# Patient Record
Sex: Female | Born: 1975 | Race: White | Hispanic: No | Marital: Married | Smoking: Never smoker
Health system: Southern US, Community
[De-identification: ages and names within clinical notes are randomized; demographics above are authoritative.]

## PROBLEM LIST (undated history)

## (undated) DIAGNOSIS — IMO0002 Reserved for concepts with insufficient information to code with codable children: Secondary | ICD-10-CM

## (undated) DIAGNOSIS — E079 Disorder of thyroid, unspecified: Secondary | ICD-10-CM

## (undated) DIAGNOSIS — G43D0 Abdominal migraine, not intractable: Secondary | ICD-10-CM

## (undated) DIAGNOSIS — F32A Depression, unspecified: Secondary | ICD-10-CM

## (undated) DIAGNOSIS — R002 Palpitations: Secondary | ICD-10-CM

## (undated) DIAGNOSIS — F419 Anxiety disorder, unspecified: Secondary | ICD-10-CM

## (undated) DIAGNOSIS — D649 Anemia, unspecified: Secondary | ICD-10-CM

## (undated) DIAGNOSIS — M519 Unspecified thoracic, thoracolumbar and lumbosacral intervertebral disc disorder: Secondary | ICD-10-CM

## (undated) HISTORY — DX: Palpitations: R00.2

## (undated) HISTORY — DX: Anxiety disorder, unspecified: F41.9

## (undated) HISTORY — DX: Disorder of thyroid, unspecified: E07.9

## (undated) HISTORY — DX: Depression, unspecified: F32.A

## (undated) HISTORY — DX: Abdominal migraine, not intractable: G43.D0

## (undated) HISTORY — DX: Reserved for concepts with insufficient information to code with codable children: IMO0002

## (undated) HISTORY — DX: Unspecified thoracic, thoracolumbar and lumbosacral intervertebral disc disorder: M51.9

## (undated) HISTORY — DX: Anemia, unspecified: D64.9

---

## 2019-03-12 HISTORY — PX: LUMBAR DISC SURGERY: SHX700

## 2020-04-09 ENCOUNTER — Ambulatory Visit: Payer: Self-pay | Admitting: Physician Assistant

## 2020-04-30 DIAGNOSIS — M5416 Radiculopathy, lumbar region: Secondary | ICD-10-CM | POA: Insufficient documentation

## 2020-05-08 ENCOUNTER — Other Ambulatory Visit: Payer: Self-pay

## 2020-05-08 ENCOUNTER — Ambulatory Visit (INDEPENDENT_AMBULATORY_CARE_PROVIDER_SITE_OTHER): Payer: BLUE CROSS/BLUE SHIELD | Admitting: Physician Assistant

## 2020-05-08 ENCOUNTER — Encounter: Payer: Self-pay | Admitting: Physician Assistant

## 2020-05-08 VITALS — BP 90/60 | HR 86 | Temp 98.0°F | Resp 14 | Ht 60.0 in | Wt 152.0 lb

## 2020-05-08 DIAGNOSIS — G43709 Chronic migraine without aura, not intractable, without status migrainosus: Secondary | ICD-10-CM | POA: Diagnosis not present

## 2020-05-08 DIAGNOSIS — G43D0 Abdominal migraine, not intractable: Secondary | ICD-10-CM

## 2020-05-08 DIAGNOSIS — E079 Disorder of thyroid, unspecified: Secondary | ICD-10-CM

## 2020-05-08 DIAGNOSIS — M519 Unspecified thoracic, thoracolumbar and lumbosacral intervertebral disc disorder: Secondary | ICD-10-CM

## 2020-05-08 DIAGNOSIS — R002 Palpitations: Secondary | ICD-10-CM | POA: Diagnosis not present

## 2020-05-08 DIAGNOSIS — IMO0002 Reserved for concepts with insufficient information to code with codable children: Secondary | ICD-10-CM

## 2020-05-08 MED ORDER — ACYCLOVIR 400 MG PO TABS: 400.0000 mg | ORAL_TABLET | Freq: Three times a day (TID) | ORAL | 0 refills | Status: DC | PRN

## 2020-05-08 MED ORDER — TRAMADOL HCL 50 MG PO TABS
50.0000 mg | ORAL_TABLET | Freq: Every day | ORAL | 0 refills | Status: DC | PRN
Start: 2020-05-08 — End: 2021-07-03

## 2020-05-08 NOTE — Patient Instructions (Signed)
Please go to the lab today for blood work.  I will call you with your results. We will alter treatment regimen(s) if indicated by your results.   I am working on setting you up with Cardiology here in the area ASAP. Continue chronic medications.  Make sure to follow-up with your GYN and Orthopedist as scheduled.   It was very nice meeting you today. Welcome to [employer] [facility]!

## 2020-05-08 NOTE — Progress Notes (Signed)
Patient presents to clinic today to establish care. Recently moved from [place] in July. Is in need of referrals to new local specialists and medication refills.   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 Is currently on a regimen of Flecainide 50 mg BID which she endorses taking as directed. Was previously on multiple BB for control of symptoms but she could not tolerate them due to severe fatigue. Notes the Flecainide has worked very well for her. Patient denies chest pain, palpitations, lightheadedness, dizziness, vision changes or frequent headaches. Notes her BP normally runs 90-110/60-70s. Does try to keep active. Denies history of CAD or HLD.   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 This was managed by her Primary Care in [place]. She is in a regimen of NP Thyroid 90 mg daily. Notes she has been on this regimen for a couple of years. Is due for repeat thyroid levels.   Patient also with history of chronic migraine for which she is on a regimen of Emgality 120 mg every 30 days. Notes symptoms are very well-controlled with this medication. Has Rx for Imitrex but cannot remember the last time she has had to use. Also with history of abdominal migraine for which she takes Effexor 37.5 mg daily with good prevention.   Patient is currently followed by local Gynecology (history of menorrhagia) and Orthopedics (chronic lumbar disc disease with herniation). Records requested.   Health Maintenance: Immunizations -- Records requested.  PAP -- endorses UTD. Records requested.   No past medical history on file.   The histories are not reviewed yet. Please review them in the "History" navigator section and refresh this SmartLink.  Current Outpatient Medications on File Prior to Visit  Medication Sig Dispense Refill  . Cholecalciferol (VITAMIN D3) 25 MCG (1000 UT) CAPS Take 1  capsule by mouth daily.    Marland Kitchen EMGALITY 120 MG/ML SOAJ Inject 1 mL into the skin every 30 (thirty) days.    . ferrous sulfate 325 (65 FE) MG EC tablet Take 325 mg by mouth 3 (three) times daily with meals.    . flecainide (TAMBOCOR) 50 MG tablet Take 50 mg by mouth 2 (two) times daily.    . norethindrone (AYGESTIN) 5 MG tablet Take by mouth.    . NP THYROID 90 MG tablet Take 90 mg by mouth every morning.    . predniSONE (DELTASONE) 10 MG tablet prednisone 10 mg tablet  TAKE 6 TABLETS ON DAY 1 AS DIRECTED AND DECREASE BY 1 TAB EACH DAY FOR A TOTAL OF 6 DAYS    . SUMAtriptan (IMITREX) 100 MG tablet Take 100 mg by mouth 2 (two) times daily as needed.    . traMADol (ULTRAM) 50 MG tablet Take by mouth daily as needed.    . venlafaxine XR (EFFEXOR-XR) 37.5 MG 24 hr capsule Take 37.5 mg by mouth daily.    . vitamin B-12 (CYANOCOBALAMIN) 1000 MCG tablet Take 1,000 mcg by mouth daily.     No current facility-administered medications on file prior to visit.    Not on File  No family history on file.  Social History   Socioeconomic History  . Marital status: Married    Spouse name: Not on file  . Number of children: Not on file  . Years of education: Not on file  . Highest education level: Not on file  Occupational History  . Not on file  Tobacco Use  .  Smoking status: Not on file  Substance and Sexual Activity  . Alcohol use: Not on file  . Drug use: Not on file  . Sexual activity: Not on file  Other Topics Concern  . Not on file  Social History Narrative  . Not on file   Social Determinants of Health   Financial Resource Strain:   . Difficulty of Paying Living Expenses: Not on file  Food Insecurity:   . Worried About Programme researcher, broadcasting/film/video in the Last Year: Not on file  . Ran Out of Food in the Last Year: Not on file  Transportation Needs:   . Lack of Transportation (Medical): Not on file  . Lack of Transportation (Non-Medical): Not on file  Physical Activity:   . Days of  Exercise per Week: Not on file  . Minutes of Exercise per Session: Not on file  Stress:   . Feeling of Stress : Not on file  Social Connections:   . Frequency of Communication with Friends and Family: Not on file  . Frequency of Social Gatherings with Friends and Family: Not on file  . Attends Religious Services: Not on file  . Active Member of Clubs or Organizations: Not on file  . Attends Banker Meetings: Not on file  . Marital Status: Not on file  Intimate Partner Violence:   . Fear of Current or Ex-Partner: Not on file  . Emotionally Abused: Not on file  . Physically Abused: Not on file  . Sexually Abused: Not on file   ROS Pertinent ROS are listed in the HPI.   Resp 14   Ht 5' (1.524 m)   Wt 152 lb (68.9 kg)   BMI 29.69 kg/m   Physical Exam Vitals reviewed.  Constitutional:      Appearance: Normal appearance.  HENT:     Head: Normocephalic and atraumatic.     Right Ear: Tympanic membrane normal.     Left Ear: Tympanic membrane normal.  Eyes:     Conjunctiva/sclera: Conjunctivae normal.     Pupils: Pupils are equal, round, and reactive to light.  Cardiovascular:     Rate and Rhythm: Normal rate and regular rhythm.     Pulses: Normal pulses.     Heart sounds: Normal heart sounds.  Pulmonary:     Effort: Pulmonary effort is normal.     Breath sounds: Normal breath sounds.  Musculoskeletal:     Cervical back: Neck supple.  Neurological:     General: No focal deficit present.     Mental Status: She is alert.  Psychiatric:        Mood and Affect: Mood normal.    Assessment/Plan: 1. Palpitations Needs local Cardiologist. She is on Flecainide with good relief of her symptoms. Heart RRR today. BP low end of normal but she states this is normal for her. Will check CBC and CMP today. Referral to Cardiac Electrophysiology placed for monitoring. She is on Effexor along with the Flecainide but notes having multiple follow-up EKGs after Flecainide was  started with good QT interval. Will continue current regimen until specialist assessment.  - Ambulatory referral to Cardiac Electrophysiology - CBC with Differential/Platelet - Comprehensive metabolic panel   2. Thyroid disease Taking NP thyroid as directed. Will recheck TSH level today - TSH  3. Chronic migraine Well controlled. Continue current regimen.   4. Abdominal migraine, not intractable Well controlled. Continue current regimen.   5. Lumbar disc disease Tramadol refilled. Continue care with Orthopedics. Any changes  to pain medication regimen are to come from them.   This visit occurred during the SARS-CoV-2 public health emergency.  Safety protocols were in place, including screening questions prior to the visit, additional usage of staff PPE, and extensive cleaning of exam room while observing appropriate contact time as indicated for disinfecting solutions.     Piedad Climes, PA-C

## 2020-05-09 ENCOUNTER — Encounter: Payer: Self-pay | Admitting: Physician Assistant

## 2020-05-09 DIAGNOSIS — IMO0002 Reserved for concepts with insufficient information to code with codable children: Secondary | ICD-10-CM | POA: Insufficient documentation

## 2020-05-09 DIAGNOSIS — R002 Palpitations: Secondary | ICD-10-CM | POA: Insufficient documentation

## 2020-05-09 DIAGNOSIS — E079 Disorder of thyroid, unspecified: Secondary | ICD-10-CM | POA: Insufficient documentation

## 2020-05-09 DIAGNOSIS — G43D0 Abdominal migraine, not intractable: Secondary | ICD-10-CM | POA: Insufficient documentation

## 2020-05-09 DIAGNOSIS — M519 Unspecified thoracic, thoracolumbar and lumbosacral intervertebral disc disorder: Secondary | ICD-10-CM | POA: Insufficient documentation

## 2020-05-09 LAB — CBC WITH DIFFERENTIAL/PLATELET
Basophils Absolute: 0 10*3/uL (ref 0.0–0.1)
Basophils Relative: 0.8 % (ref 0.0–3.0)
Eosinophils Absolute: 0.1 10*3/uL (ref 0.0–0.7)
Eosinophils Relative: 1.8 % (ref 0.0–5.0)
HCT: 34.8 % — ABNORMAL LOW (ref 36.0–46.0)
Hemoglobin: 11.2 g/dL — ABNORMAL LOW (ref 12.0–15.0)
Lymphocytes Relative: 28.2 % (ref 12.0–46.0)
Lymphs Abs: 1.8 10*3/uL (ref 0.7–4.0)
MCHC: 32.3 g/dL (ref 30.0–36.0)
MCV: 85.3 fl (ref 78.0–100.0)
Monocytes Absolute: 1.3 10*3/uL — ABNORMAL HIGH (ref 0.1–1.0)
Monocytes Relative: 20.2 % — ABNORMAL HIGH (ref 3.0–12.0)
Neutro Abs: 3.1 10*3/uL (ref 1.4–7.7)
Neutrophils Relative %: 49 % (ref 43.0–77.0)
Platelets: 288 10*3/uL (ref 150.0–400.0)
RBC: 4.08 Mil/uL (ref 3.87–5.11)
RDW: 17.9 % — ABNORMAL HIGH (ref 11.5–15.5)
WBC: 6.3 10*3/uL (ref 4.0–10.5)

## 2020-05-09 LAB — COMPREHENSIVE METABOLIC PANEL
ALT: 14 U/L (ref 0–35)
AST: 11 U/L (ref 0–37)
Albumin: 4.3 g/dL (ref 3.5–5.2)
Alkaline Phosphatase: 40 U/L (ref 39–117)
BUN: 13 mg/dL (ref 6–23)
CO2: 24 mEq/L (ref 19–32)
Calcium: 8.9 mg/dL (ref 8.4–10.5)
Chloride: 103 mEq/L (ref 96–112)
Creatinine, Ser: 0.92 mg/dL (ref 0.40–1.20)
GFR: 66.24 mL/min (ref >60.00)
Glucose, Bld: 83 mg/dL (ref 70–99)
Potassium: 4.1 mEq/L (ref 3.5–5.1)
Sodium: 136 mEq/L (ref 135–145)
Total Bilirubin: 0.8 mg/dL (ref 0.2–1.2)
Total Protein: 7.2 g/dL (ref 6.0–8.3)

## 2020-05-09 LAB — TSH: TSH: 1.87 u[IU]/mL (ref 0.35–4.50)

## 2020-05-11 ENCOUNTER — Encounter: Payer: Self-pay | Admitting: Physician Assistant

## 2020-05-11 ENCOUNTER — Other Ambulatory Visit (INDEPENDENT_AMBULATORY_CARE_PROVIDER_SITE_OTHER): Payer: BLUE CROSS/BLUE SHIELD

## 2020-05-11 DIAGNOSIS — D649 Anemia, unspecified: Secondary | ICD-10-CM

## 2020-05-11 LAB — IBC PANEL
Iron: 394 ug/dL — ABNORMAL HIGH (ref 42–145)
Saturation Ratios: 100.5 % — ABNORMAL HIGH (ref 20.0–50.0)
Transferrin: 280 mg/dL (ref 212.0–360.0)

## 2020-05-14 ENCOUNTER — Institutional Professional Consult (permissible substitution): Payer: BLUE CROSS/BLUE SHIELD | Admitting: Internal Medicine

## 2020-05-24 ENCOUNTER — Institutional Professional Consult (permissible substitution): Payer: BLUE CROSS/BLUE SHIELD | Admitting: Cardiology

## 2020-06-01 ENCOUNTER — Other Ambulatory Visit: Payer: Self-pay | Admitting: Physician Assistant

## 2020-06-10 ENCOUNTER — Other Ambulatory Visit: Payer: Self-pay | Admitting: Physician Assistant

## 2020-09-04 ENCOUNTER — Other Ambulatory Visit: Payer: Self-pay

## 2020-09-04 ENCOUNTER — Ambulatory Visit (INDEPENDENT_AMBULATORY_CARE_PROVIDER_SITE_OTHER): Payer: BLUE CROSS/BLUE SHIELD | Admitting: Cardiology

## 2020-09-04 ENCOUNTER — Encounter: Payer: Self-pay | Admitting: Cardiology

## 2020-09-04 VITALS — BP 102/66 | HR 91 | Ht 60.0 in | Wt 154.0 lb

## 2020-09-04 DIAGNOSIS — I493 Ventricular premature depolarization: Secondary | ICD-10-CM

## 2020-09-04 DIAGNOSIS — E079 Disorder of thyroid, unspecified: Secondary | ICD-10-CM | POA: Diagnosis not present

## 2020-09-04 NOTE — Patient Instructions (Signed)
Medication Instructions:  Your physician has recommended you make the following change in your medication:   1.  STOP taking flecainide  Labwork: None ordered.  Testing/Procedures: Your physician has requested that you have an echocardiogram. Echocardiography is a painless test that uses sound waves to create images of your heart. It provides your doctor with information about the size and shape of your heart and how well your heart's chambers and valves are working. This procedure takes approximately one hour. There are no restrictions for this procedure.  Please schedule for ECHO  Follow-Up: Your physician wants you to follow-up in: 3 months with Dr. Lalla Brothers.    Any Other Special Instructions Will Be Listed Below (If Applicable).  If you need a refill on your cardiac medications before your next appointment, please call your pharmacy.

## 2020-09-04 NOTE — Progress Notes (Signed)
Electrophysiology Office Note:    Date:  09/04/2020   ID:  Mary Russell, DOB 1976/01/02, MRN [id number]  PCP:  Noel [facility] Cardiologist:  No primary care provider on file.  [facility] Electrophysiologist:  None   Referring MD: Waldon Merl, PA-C   Chief Complaint: PVCs  History of Present Illness:    Mary Russell is a 45 y.o. female who presents for an evaluation of PVCs at the request of Marcelline Mates, PA-C. Their medical history includes abdominal migraines, anemia, anxiety, migraine, depression, thyroid disease.  Patient recently moved here from [place] where she was being seen for symptomatic PVCs.  She was previously managed with beta-blockade but she did not tolerate this medication.  She has been taking her flecainide but only taking it once per day.  She tells me that her symptoms of PVCs have dramatically improved and she is interested in stopping the flecainide and seeing how she does.     Past Medical History:  Diagnosis Date  . Abdominal migraine   . Anemia   . Anxiety   . Chronic migraine   . Depression   . Lumbar disc disease   . Palpitations   . Thyroid disease    Hoshimotos    Past Surgical History:  Procedure Laterality Date  . CESAREAN SECTION  08/2004   06/2007  . LUMBAR DISC SURGERY  03/2019   L5-S1    Current Medications: Current Meds  Medication Sig  . acyclovir (ZOVIRAX) 400 MG tablet TAKE 1 TABLET (400 MG TOTAL) BY MOUTH 3 (THREE) TIMES DAILY AS NEEDED.  Marland Kitchen Cholecalciferol (VITAMIN D3) 25 MCG (1000 UT) CAPS Take 1 capsule by mouth daily.  . [employer] PURELAX 17 GM/SCOOP powder Take by mouth as needed.  Marland Kitchen EMGALITY 120 MG/ML SOAJ Inject 1 mL into the skin every 30 (thirty) days.  . ferrous sulfate 325 (65 FE) MG EC tablet Take 325 mg by mouth daily.  . flecainide (TAMBOCOR) 50 MG tablet Take 50 mg by mouth 2 (two) times daily.  Marland Kitchen gabapentin (NEURONTIN) 300 MG capsule Take 300 mg by mouth 3 (three) times  daily.  Marland Kitchen lidocaine (LIDODERM) 5 % 1 patch as needed.  . naproxen (NAPROSYN) 375 MG tablet Take by mouth as needed.  . NP THYROID 90 MG tablet Take 90 mg by mouth every morning.  . ondansetron (ZOFRAN-ODT) 4 MG disintegrating tablet Take 4 mg by mouth every 8 (eight) hours as needed.  . predniSONE (DELTASONE) 10 MG tablet as needed.  . SUMAtriptan (IMITREX) 100 MG tablet Take 100 mg by mouth 2 (two) times daily as needed.  . traMADol (ULTRAM) 50 MG tablet Take 1 tablet (50 mg total) by mouth daily as needed.  . venlafaxine XR (EFFEXOR-XR) 37.5 MG 24 hr capsule Take 37.5 mg by mouth daily.  . vitamin B-12 (CYANOCOBALAMIN) 1000 MCG tablet Take 1,000 mcg by mouth daily.     Allergies:   Amoxicillin and Other   Social History   Socioeconomic History  . Marital status: Married    Spouse name: Not on file  . Number of children: 2  . Years of education: Not on file  . Highest education level: Not on file  Occupational History  . Not on file  Tobacco Use  . Smoking status: Never Smoker  . Smokeless tobacco: Never Used  Vaping Use  . Vaping Use: Never used  Substance and Sexual Activity  . Alcohol use: Not Currently  . Drug use: Never  .  Sexual activity: Yes    Birth control/protection: Surgical    Comment: [place]-2009  Other Topics Concern  . Not on file  Social History Narrative  . Not on file   Social Determinants of Health   Financial Resource Strain: Not on file  Food Insecurity: Not on file  Transportation Needs: Not on file  Physical Activity: Not on file  Stress: Not on file  Social Connections: Not on file     Family History: The patient's family history includes Alcohol abuse in her maternal grandfather and mother; Anxiety disorder in her paternal grandfather and paternal grandmother; COPD in her maternal grandmother and mother; Depression in her father and sister; Diabetes in her father and paternal grandmother; Heart disease in her maternal grandfather and  maternal grandmother; Hyperlipidemia in her maternal grandmother and mother; Hypertension in her mother.  ROS:   Please see the history of present illness.    All other systems reviewed and are negative.  EKGs/Labs/Other Studies Reviewed:    The following studies were reviewed today:   EKG:  The ekg ordered today demonstrates sinus rhythm.  No PVCs.  Recent Labs: 05/08/2020: ALT 14; BUN 13; Creatinine, Ser 0.92; Hemoglobin 11.2; Platelets 288.0; Potassium 4.1; Sodium 136; TSH 1.87  Recent Lipid Panel No results found for: CHOL, TRIG, HDL, CHOLHDL, VLDL, LDLCALC, LDLDIRECT  Physical Exam:    VS:  BP 102/66   Pulse 91   Ht 5' (1.524 m)   Wt 154 lb (69.9 kg)   SpO2 98%   BMI 30.08 kg/m     Wt Readings from Last 3 Encounters:  09/04/20 154 lb (69.9 kg)  05/08/20 152 lb (68.9 kg)     GEN:  Well nourished, well developed in no acute distress HEENT: Normal NECK: No JVD; No carotid bruits LYMPHATICS: No lymphadenopathy CARDIAC: RRR, no murmurs, rubs, gallops RESPIRATORY:  Clear to auscultation without rales, wheezing or rhonchi  ABDOMEN: Soft, non-tender, non-distended MUSCULOSKELETAL:  No edema; No deformity  SKIN: Warm and dry NEUROLOGIC:  Alert and oriented x 3 PSYCHIATRIC:  Normal affect   ASSESSMENT:    1. PVC (premature ventricular contraction)   2. Thyroid disease    PLAN:    In order of problems listed above:  1. PVC Plan to stop the flecainide for now. Will plan to see the patient back in 3 months to reassess symptoms. Check echo to assess for structural heart disease in case there is a future need to restart an echo.   Plan for follow up in 1 year.   Medication Adjustments/Labs and Tests Ordered: Current medicines are reviewed at length with the patient today.  Concerns regarding medicines are outlined above.  No orders of the defined types were placed in this encounter.  No orders of the defined types were placed in this  encounter.    Signed, Steffanie Dunn, MD, [facility]  09/04/2020 3:03 PM    Electrophysiology [facility]

## 2020-09-27 ENCOUNTER — Other Ambulatory Visit (HOSPITAL_COMMUNITY): Payer: BLUE CROSS/BLUE SHIELD

## 2020-10-17 ENCOUNTER — Telehealth (HOSPITAL_COMMUNITY): Payer: Self-pay | Admitting: Cardiology

## 2020-10-17 NOTE — Telephone Encounter (Signed)
Patient called and cancelled echocardiogram due to Insurance reasons and did not wish to reschedule at this time. Order will be removed from the WQ and if patient calls back to reschedule we will reinstate the order.

## 2020-10-18 ENCOUNTER — Other Ambulatory Visit (HOSPITAL_COMMUNITY): Payer: BLUE CROSS/BLUE SHIELD

## 2020-10-18 ENCOUNTER — Encounter (HOSPITAL_COMMUNITY): Payer: Self-pay

## 2020-12-04 ENCOUNTER — Ambulatory Visit: Payer: BC Managed Care – PPO | Admitting: Cardiology

## 2020-12-14 ENCOUNTER — Other Ambulatory Visit: Payer: Self-pay

## 2020-12-14 ENCOUNTER — Ambulatory Visit (HOSPITAL_COMMUNITY): Payer: BC Managed Care – PPO | Attending: Internal Medicine

## 2020-12-14 DIAGNOSIS — I493 Ventricular premature depolarization: Secondary | ICD-10-CM

## 2020-12-14 DIAGNOSIS — E079 Disorder of thyroid, unspecified: Secondary | ICD-10-CM | POA: Diagnosis present

## 2020-12-14 LAB — ECHOCARDIOGRAM COMPLETE
Area-P 1/2: 3.68 cm2
S' Lateral: 2.4 cm

## 2021-01-16 NOTE — Progress Notes (Signed)
Virtual Visit via Telephone Note   This visit type was conducted due to national recommendations for restrictions regarding the COVID-19 Pandemic (e.g. social distancing) in an effort to limit this patient's exposure and mitigate transmission in our community.  Due to her co-morbid illnesses, this patient is at least at moderate risk for complications without adequate follow up.  This format is felt to be most appropriate for this patient at this time.  The patient did not have access to video technology/had technical difficulties with video requiring transitioning to audio format only (telephone).  All issues noted in this document were discussed and addressed.  No physical exam could be performed with this format.  Please refer to the patient's chart for her  consent to telehealth for [facility].    Date:  01/17/2021   ID:  Mary Russell, DOB 04-11-1976, MRN [id number] The patient was identified using 2 identifiers.  Patient Location: Home Provider Location: Home Office   PCP:  Waldon Merl, PA-C   [facility] Providers Cardiologist:  None Electrophysiologist:  Lanier Prude, MD     Evaluation Performed:  Follow-Up Visit  Chief Complaint: PVC  History of Present Illness:    Mary Russell is a 45 y.o. female with symptomatic PVCs who presents for follow-up.  I last saw the patient on September 04, 2020.  At that appointment she was doing well on flecainide but wanted to stop the flecainide to see how the burden of PVCs was off medication.  She stopped the medication at that point and we ordered an echocardiogram.  Her echocardiogram was completed in May and showed normal left ventricular function.  She tells me that her palpitations really persisted and so she restarted her flecainide.  She tells me that the palpitations she feels are most prevalent in the morning but can happen throughout the day.  She thinks they are triggered by anxiety and stress.   Past  Medical History:  Diagnosis Date   Abdominal migraine    Anemia    Anxiety    Chronic migraine    Depression    Lumbar disc disease    Palpitations    Thyroid disease    Hoshimotos   Past Surgical History:  Procedure Laterality Date   CESAREAN SECTION  08/2004   06/2007   LUMBAR DISC SURGERY  03/2019   L5-S1     Current Meds  Medication Sig   acyclovir (ZOVIRAX) 400 MG tablet TAKE 1 TABLET (400 MG TOTAL) BY MOUTH 3 (THREE) TIMES DAILY AS NEEDED.   Cholecalciferol (VITAMIN D3) 25 MCG (1000 UT) CAPS Take 1 capsule by mouth daily.   [employer] PURELAX 17 GM/SCOOP powder Take by mouth as needed.   EMGALITY 120 MG/ML SOAJ Inject 1 mL into the skin every 30 (thirty) days.   flecainide (TAMBOCOR) 50 MG tablet Take 50 mg by mouth 2 (two) times daily.   gabapentin (NEURONTIN) 300 MG capsule Take 300 mg by mouth 3 (three) times daily.   lidocaine (LIDODERM) 5 % 1 patch as needed.   naproxen (NAPROSYN) 375 MG tablet Take by mouth as needed.   NP THYROID 90 MG tablet Take 90 mg by mouth every morning.   ondansetron (ZOFRAN-ODT) 4 MG disintegrating tablet Take 4 mg by mouth every 8 (eight) hours as needed.   predniSONE (DELTASONE) 10 MG tablet as needed.   SUMAtriptan (IMITREX) 100 MG tablet Take 100 mg by mouth 2 (two) times daily as needed.   traMADol (ULTRAM) 50 MG  tablet Take 1 tablet (50 mg total) by mouth daily as needed.   venlafaxine XR (EFFEXOR-XR) 37.5 MG 24 hr capsule Take 37.5 mg by mouth daily.   vitamin B-12 (CYANOCOBALAMIN) 1000 MCG tablet Take 1,000 mcg by mouth daily.     Allergies:   Amoxicillin and Other   Social History   Tobacco Use   Smoking status: Never   Smokeless tobacco: Never  Vaping Use   Vaping Use: Never used  Substance Use Topics   Alcohol use: Not Currently   Drug use: Never     Family Hx: The patient's family history includes Alcohol abuse in her maternal grandfather and mother; Anxiety disorder in her paternal grandfather and paternal grandmother;  COPD in her maternal grandmother and mother; Depression in her father and sister; Diabetes in her father and paternal grandmother; Heart disease in her maternal grandfather and maternal grandmother; Hyperlipidemia in her maternal grandmother and mother; Hypertension in her mother.  ROS:   Please see the history of present illness.     All other systems reviewed and are negative.   Prior CV studies:   The following studies were reviewed today:  Dec 14, 2020 echo personally reviewed Left ventricular function normal, 65% Right ventricular function normal No significant valvular abnormalities  Labs/Other Tests and Data Reviewed:    Recent Labs: 05/08/2020: ALT 14; BUN 13; Creatinine, Ser 0.92; Hemoglobin 11.2; Platelets 288.0; Potassium 4.1; Sodium 136; TSH 1.87   Recent Lipid Panel No results found for: CHOL, TRIG, HDL, CHOLHDL, LDLCALC, LDLDIRECT  Wt Readings from Last 3 Encounters:  01/17/21 150 lb (68 kg)  09/04/20 154 lb (69.9 kg)  05/08/20 152 lb (68.9 kg)      Objective:    Vital Signs:  BP 109/76   Pulse 87   Ht 5' (1.524 m)   Wt 150 lb (68 kg)   BMI 29.29 kg/m    VITAL SIGNS:  reviewed NEURO:  alert and oriented x 3, no obvious focal deficit PSYCH:  normal affect  ASSESSMENT & PLAN:    Palpitations Reported history of PVCs on flecainide.  Has previously tried beta-blockers but did not tolerate them because of fatigue.  I would like to start with a 2-week ZIO monitor so that I can get a better understanding of what she is feeling and the burden of ectopy.  We will plan to touch base virtually in 6 weeks to review the results and discuss next steps.  For now, she can continue her current medical therapy.  Follow-up 6 weeks virtually     Time:   Today, I have spent 10 minutes with the patient with telehealth technology discussing the above problems.     Medication Adjustments/Labs and Tests Ordered: Current medicines are reviewed at length with the patient  today.  Concerns regarding medicines are outlined above.   Tests Ordered: No orders of the defined types were placed in this encounter.   Medication Changes: No orders of the defined types were placed in this encounter.   Signed, Lanier Prude, MD  01/17/2021 9:43 AM    [facility]

## 2021-01-17 ENCOUNTER — Ambulatory Visit (INDEPENDENT_AMBULATORY_CARE_PROVIDER_SITE_OTHER): Payer: BC Managed Care – PPO

## 2021-01-17 ENCOUNTER — Telehealth (INDEPENDENT_AMBULATORY_CARE_PROVIDER_SITE_OTHER): Payer: BC Managed Care – PPO | Admitting: Cardiology

## 2021-01-17 ENCOUNTER — Telehealth: Payer: Self-pay | Admitting: Cardiology

## 2021-01-17 ENCOUNTER — Other Ambulatory Visit: Payer: Self-pay

## 2021-01-17 ENCOUNTER — Encounter: Payer: Self-pay | Admitting: Cardiology

## 2021-01-17 VITALS — BP 109/76 | HR 87 | Ht 60.0 in | Wt 150.0 lb

## 2021-01-17 DIAGNOSIS — I493 Ventricular premature depolarization: Secondary | ICD-10-CM | POA: Diagnosis not present

## 2021-01-17 MED ORDER — FLECAINIDE ACETATE 50 MG PO TABS: 50.0000 mg | ORAL_TABLET | Freq: Two times a day (BID) | ORAL | 3 refills | Status: DC

## 2021-01-17 NOTE — Progress Notes (Unsigned)
Enrolled patient for a 14 day Zio XT  monitor to be mailed to patients home  °

## 2021-01-17 NOTE — Patient Instructions (Addendum)
Medication Instructions:  Your physician recommends that you continue on your current medications as directed. Please refer to the Current Medication list given to you today. *If you need a refill on your cardiac medications before your next appointment, please call your pharmacy*  Lab Work: None ordered. If you have labs (blood work) drawn today and your tests are completely normal, you will receive your results only by: MyChart Message (if you have MyChart) OR A paper copy in the mail If you have any lab test that is abnormal or we need to change your treatment, we will call you to review the results.  Testing/Procedures: Your physician has recommended that you wear a holter monitor. Holter monitors are medical devices that record the heart's electrical activity. Doctors most often use these monitors to diagnose arrhythmias. Arrhythmias are problems with the speed or rhythm of the heartbeat. The monitor is a small, portable device. You can wear one while you do your normal daily activities. This is usually used to diagnose what is causing palpitations/syncope (passing out).  You will wear a 14 day ZIO monitor  Follow-Up: At [facility], you and your health needs are our priority.  As part of our continuing mission to provide you with exceptional heart care, we have created designated Provider Care Teams.  These Care Teams include your primary Cardiologist (physician) and Advanced Practice Providers (APPs -  Physician Assistants and Nurse Practitioners) who all work together to provide you with the care you need, when you need it.  Your next appointment:   Your physician wants you to follow-up in: 6 weeks with Dr. Lalla Brothers for a virtual visit.  February 27, 2021 at 10:20 AM  Your physician has recommended that you wear a Zio monitor.   This monitor is a medical device that records the heart's electrical activity. Doctors most often use these monitors to diagnose arrhythmias. Arrhythmias are  problems with the speed or rhythm of the heartbeat. The monitor is a small device applied to your chest. You can wear one while you do your normal daily activities. While wearing this monitor if you have any symptoms to push the button and record what you felt. Once you have worn this monitor for the period of time provider prescribed (Usually 14 days), you will return the monitor device in the postage paid box. Once it is returned they will download the data collected and provide [place] with a report which the provider will then review and we will call you with those results. Important tips:  Avoid showering during the first 24 hours of wearing the monitor. Avoid excessive sweating to help maximize wear time. Do not submerge the device, no hot tubs, and no swimming pools. Keep any lotions or oils away from the patch. After 24 hours you may shower with the patch on. Take brief showers with your back facing the shower head.  Do not remove patch once it has been placed because that will interrupt data and decrease adhesive wear time. Push the button when you have any symptoms and write down what you were feeling. Once you have completed wearing your monitor, remove and place into box which has postage paid and place in your outgoing mailbox.  If for some reason you have misplaced your box then call our office and we can provide another box and/or mail it off for you.

## 2021-01-17 NOTE — Addendum Note (Signed)
Addended by: Kerry Odonohue T on: 01/17/2021 02:06 PM   Modules accepted: Level of Service  

## 2021-01-17 NOTE — Telephone Encounter (Signed)
Pt's medication was sent to pt's pharmacy as requested. Confirmation received.  °

## 2021-01-17 NOTE — Telephone Encounter (Signed)
*  STAT* If patient is at the pharmacy, call can be transferred to refill team.   1. Which medications need to be refilled? (please list name of each medication and dose if known) flecainide (TAMBOCOR) 50 MG tablet  2. Which pharmacy/location (including street and city if local pharmacy) is medication to be sent to? [id number] - [place], [place] - [address]  3. Do they need a 30 day or 90 day supply? 90 day supply   This pt is all out of this medication

## 2021-01-22 DIAGNOSIS — I493 Ventricular premature depolarization: Secondary | ICD-10-CM | POA: Diagnosis not present

## 2021-02-07 ENCOUNTER — Ambulatory Visit: Payer: BLUE CROSS/BLUE SHIELD | Admitting: Family Medicine

## 2021-02-26 NOTE — Progress Notes (Signed)
Virtual Visit via Telephone Note   This visit type was conducted due to national recommendations for restrictions regarding the COVID-19 Pandemic (e.g. social distancing) in an effort to limit this patient's exposure and mitigate transmission in our community.  Due to her co-morbid illnesses, this patient is at least at moderate risk for complications without adequate follow up.  This format is felt to be most appropriate for this patient at this time.  The patient did not have access to video technology/had technical difficulties with video requiring transitioning to audio format only (telephone).  All issues noted in this document were discussed and addressed.  No physical exam could be performed with this format.  Please refer to the patient's chart for her  consent to telehealth for [facility].    Date:  02/27/2021   ID:  Mary Russell, DOB 09-Jun-1976, MRN [id number] The patient was identified using 2 identifiers.  Patient Location: Home Provider Location: Office/Clinic   PCP:  No primary care provider on file.    Evaluation Performed:  Follow-Up Visit  Chief Complaint: Palpitations  History of Present Illness:    Mary Russell is a 45 y.o. 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  The patient tells me that since starting flecainide she has not noticed any difference in her symptoms.  No syncope or presyncope.  She thinks there is relationship between her stress/anxiety level and the symptoms associated with her PVCs.  The patient does not have symptoms concerning for COVID-19 infection (fever, chills, cough, or new shortness of breath).    Past Medical History:  Diagnosis Date   Abdominal migraine    Anemia    Anxiety    Chronic migraine    Depression    Lumbar disc disease    Palpitations    Thyroid disease    Hoshimotos   Past Surgical History:  Procedure Laterality Date   CESAREAN SECTION  08/2004   06/2007   LUMBAR DISC  SURGERY  03/2019   L5-S1     No outpatient medications have been marked as taking for the 02/27/21 encounter (Appointment) with Lanier Prude, MD.     Allergies:   Amoxicillin and Other   Social History   Tobacco Use   Smoking status: Never   Smokeless tobacco: Never  Vaping Use   Vaping Use: Never used  Substance Use Topics   Alcohol use: Not Currently   Drug use: Never     Family Hx: The patient's family history includes Alcohol abuse in her maternal grandfather and mother; Anxiety disorder in her paternal grandfather and paternal grandmother; COPD in her maternal grandmother and mother; Depression in her father and sister; Diabetes in her father and paternal grandmother; Heart disease in her maternal grandfather and maternal grandmother; Hyperlipidemia in her maternal grandmother and mother; Hypertension in her mother.  ROS:   Please see the history of present illness.     All other systems reviewed and are negative.   Prior CV studies:   The following studies were reviewed today:  Dec 14, 2020 echo personally reviewed shows normal left ventricular function and no significant valvular abnormalities  February 14, 2021 ZIO monitor personally reviewed HR 55 - 150, average 85bpm. 1 episode of SVT lasting 14 beats at a rate of 120 bpm (likely AT) Rare supraventricular and ventricular ectopy. Patient triggered episodes correspond to sinus rhythm and AT episode. No sustained arrhythmias.   Labs/Other Tests and Data Reviewed:    EKG:  No ECG  reviewed.  Recent Labs: 05/08/2020: ALT 14; BUN 13; Creatinine, Ser 0.92; Hemoglobin 11.2; Platelets 288.0; Potassium 4.1; Sodium 136; TSH 1.87   Recent Lipid Panel No results found for: CHOL, TRIG, HDL, CHOLHDL, LDLCALC, LDLDIRECT  Wt Readings from Last 3 Encounters:  01/17/21 150 lb (68 kg)  09/04/20 154 lb (69.9 kg)  05/08/20 152 lb (68.9 kg)        Objective:    Vital Signs:  There were no vitals taken for this visit.    VITAL SIGNS:  reviewed  ASSESSMENT & PLAN:    1. PVC (premature ventricular contraction) Overall very low burden but symptomatic.  She thinks there is relationship between her anxiety/stress level and the symptoms associated with her PVC.  Given there are no change in symptoms on flecainide I would favor discontinuing this medication today.  She is agreeable to this plan.  Also would like to refer her to Georgiann Mohs, PsyD to discuss her stressors and how they relate to the PVC symptoms.  I will have her follow-up with a PA/NP in about 9 months to see how she is doing.  2. Encounter for long-term (current) use of high-risk medication Discontinuing flecainide today.   Time:   Today, I have spent 8 minutes with the patient with telehealth technology discussing the above problems.     Medication Adjustments/Labs and Tests Ordered: Current medicines are reviewed at length with the patient today.  Concerns regarding medicines are outlined above.   Tests Ordered: No orders of the defined types were placed in this encounter.   Medication Changes: No orders of the defined types were placed in this encounter.   Follow Up: 9 months w/ NP/PA  Signed, Lanier Prude, MD  02/27/2021 10:43 AM    [facility]

## 2021-02-27 ENCOUNTER — Other Ambulatory Visit: Payer: Self-pay

## 2021-02-27 ENCOUNTER — Telehealth (INDEPENDENT_AMBULATORY_CARE_PROVIDER_SITE_OTHER): Payer: BC Managed Care – PPO | Admitting: Cardiology

## 2021-02-27 DIAGNOSIS — Z79899 Other long term (current) drug therapy: Secondary | ICD-10-CM | POA: Diagnosis not present

## 2021-02-27 DIAGNOSIS — I493 Ventricular premature depolarization: Secondary | ICD-10-CM | POA: Diagnosis not present

## 2021-02-27 NOTE — Addendum Note (Signed)
Addended by: Roney Mans A on: 02/27/2021 11:00 AM   Modules accepted: Orders

## 2021-02-27 NOTE — Patient Instructions (Signed)
Medication Instructions:  Your physician has recommended you make the following change in your medication:    STOP flecainide  *If you need a refill on your cardiac medications before your next appointment, please call your pharmacy*  Lab Work: None ordered. If you have labs (blood work) drawn today and your tests are completely normal, you will receive your results only by: MyChart Message (if you have MyChart) OR A paper copy in the mail If you have any lab test that is abnormal or we need to change your treatment, we will call you to review the results.  Testing/Procedures: None ordered.  Follow-Up: At [facility], you and your health needs are our priority.  As part of our continuing mission to provide you with exceptional heart care, we have created designated Provider Care Teams.  These Care Teams include your primary Cardiologist (physician) and Advanced Practice Providers (APPs -  Physician Assistants and Nurse Practitioners) who all work together to provide you with the care you need, when you need it.  Your next appointment:   Your physician wants you to follow-up in: 9 months with EP APP at [facility] office.   You will receive a reminder letter in the mail two months in advance. If you don't receive a letter, please call our office to schedule the follow-up appointment.  You are being referred to Dr. Hilbert Corrigan.  You will be contacted to schedule an appointment with him.

## 2021-06-12 ENCOUNTER — Ambulatory Visit: Payer: BC Managed Care – PPO | Admitting: Podiatry

## 2021-06-24 ENCOUNTER — Ambulatory Visit: Payer: BC Managed Care – PPO | Admitting: Podiatry

## 2021-07-03 ENCOUNTER — Other Ambulatory Visit: Payer: Self-pay

## 2021-07-03 ENCOUNTER — Encounter (HOSPITAL_COMMUNITY): Payer: Self-pay

## 2021-07-03 ENCOUNTER — Emergency Department (HOSPITAL_COMMUNITY): Payer: BC Managed Care – PPO

## 2021-07-03 ENCOUNTER — Emergency Department (HOSPITAL_COMMUNITY)
Admission: EM | Admit: 2021-07-03 | Discharge: 2021-07-03 | Disposition: A | Discharge status: Home / Self Care | Payer: BC Managed Care – PPO | Attending: Emergency Medicine | Admitting: Emergency Medicine

## 2021-07-03 DIAGNOSIS — R1031 Right lower quadrant pain: Secondary | ICD-10-CM | POA: Insufficient documentation

## 2021-07-03 DIAGNOSIS — R102 Pelvic and perineal pain: Secondary | ICD-10-CM

## 2021-07-03 DIAGNOSIS — R000 Tachycardia, unspecified: Secondary | ICD-10-CM | POA: Insufficient documentation

## 2021-07-03 DIAGNOSIS — N9489 Other specified conditions associated with female genital organs and menstrual cycle: Secondary | ICD-10-CM | POA: Insufficient documentation

## 2021-07-03 DIAGNOSIS — Z79899 Other long term (current) drug therapy: Secondary | ICD-10-CM | POA: Insufficient documentation

## 2021-07-03 DIAGNOSIS — N838 Other noninflammatory disorders of ovary, fallopian tube and broad ligament: Secondary | ICD-10-CM

## 2021-07-03 LAB — COMPREHENSIVE METABOLIC PANEL
ALT: 20 U/L (ref 0–44)
AST: 18 U/L (ref 15–41)
Albumin: 4.4 g/dL (ref 3.5–5.0)
Alkaline Phosphatase: 52 U/L (ref 38–126)
Anion gap: 10 (ref 5–15)
BUN: 10 mg/dL (ref 6–20)
CO2: 21 mmol/L — ABNORMAL LOW (ref 22–32)
Calcium: 8.9 mg/dL (ref 8.9–10.3)
Chloride: 106 mmol/L (ref 98–111)
Creatinine, Ser: 0.74 mg/dL (ref 0.44–1.00)
GFR, Estimated: >60 mL/min (ref >60)
Glucose, Bld: 93 mg/dL (ref 70–99)
Potassium: 3.5 mmol/L (ref 3.5–5.1)
Sodium: 137 mmol/L (ref 135–145)
Total Bilirubin: 0.9 mg/dL (ref 0.3–1.2)
Total Protein: 7.5 g/dL (ref 6.5–8.1)

## 2021-07-03 LAB — CBC
HCT: 38.1 % (ref 36.0–46.0)
Hemoglobin: 12.9 g/dL (ref 12.0–15.0)
MCH: 29.5 pg (ref 26.0–34.0)
MCHC: 33.9 g/dL (ref 30.0–36.0)
MCV: 87 fL (ref 80.0–100.0)
Platelets: 232 10*3/uL (ref 150–400)
RBC: 4.38 MIL/uL (ref 3.87–5.11)
RDW: 12.7 % (ref 11.5–15.5)
WBC: 6.4 10*3/uL (ref 4.0–10.5)
nRBC: 0 % (ref 0.0–0.2)

## 2021-07-03 LAB — URINALYSIS, ROUTINE W REFLEX MICROSCOPIC
Bilirubin Urine: NEGATIVE
Glucose, UA: NEGATIVE mg/dL
Hgb urine dipstick: NEGATIVE
Ketones, ur: NEGATIVE mg/dL
Leukocytes,Ua: NEGATIVE
Nitrite: NEGATIVE
Protein, ur: NEGATIVE mg/dL
Specific Gravity, Urine: 1.003 — ABNORMAL LOW (ref 1.005–1.030)
pH: 6 (ref 5.0–8.0)

## 2021-07-03 LAB — I-STAT BETA HCG BLOOD, ED (MC, WL, AP ONLY): I-stat hCG, quantitative: <5 m[IU]/mL (ref <5)

## 2021-07-03 LAB — LIPASE, BLOOD: Lipase: 35 U/L (ref 11–51)

## 2021-07-03 MED ORDER — SODIUM CHLORIDE 0.9 % IV BOLUS
1000.0000 mL | Freq: Once | INTRAVENOUS | Status: AC
  Administered 2021-07-03: 1000 mL via INTRAVENOUS

## 2021-07-03 MED ORDER — MORPHINE SULFATE (PF) 4 MG/ML IV SOLN
4.0000 mg | Freq: Once | INTRAVENOUS | Status: AC
  Administered 2021-07-03: 4 mg via INTRAVENOUS
  Filled 2021-07-03: qty 1

## 2021-07-03 MED ORDER — FENTANYL CITRATE PF 50 MCG/ML IJ SOSY
50.0000 ug | PREFILLED_SYRINGE | Freq: Once | INTRAMUSCULAR | Status: AC
  Administered 2021-07-03: 50 ug via INTRAVENOUS
  Filled 2021-07-03: qty 1

## 2021-07-03 MED ORDER — IOHEXOL 300 MG/ML  SOLN
80.0000 mL | Freq: Once | INTRAMUSCULAR | Status: AC | PRN
  Administered 2021-07-03: 80 mL via INTRAVENOUS

## 2021-07-03 MED ORDER — ONDANSETRON HCL 4 MG PO TABS: 4.0000 mg | ORAL_TABLET | Freq: Three times a day (TID) | ORAL | 0 refills | Status: AC | PRN

## 2021-07-03 MED ORDER — ONDANSETRON HCL 4 MG/2ML IJ SOLN
4.0000 mg | Freq: Once | INTRAMUSCULAR | Status: AC
  Administered 2021-07-03: 4 mg via INTRAVENOUS
  Filled 2021-07-03: qty 2

## 2021-07-03 MED ORDER — KETOROLAC TROMETHAMINE 30 MG/ML IJ SOLN
15.0000 mg | Freq: Once | INTRAMUSCULAR | Status: AC
  Administered 2021-07-03: 15 mg via INTRAVENOUS
  Filled 2021-07-03: qty 1

## 2021-07-03 MED ORDER — CELECOXIB 200 MG PO CAPS: 200.0000 mg | ORAL_CAPSULE | Freq: Two times a day (BID) | ORAL | 0 refills | Status: AC

## 2021-07-03 MED ORDER — TRAMADOL HCL 50 MG PO TABS: 50.0000 mg | ORAL_TABLET | Freq: Four times a day (QID) | ORAL | 0 refills | Status: AC | PRN

## 2021-07-03 NOTE — ED Notes (Signed)
Patient is resting comfortably. Updated on POC. Awaiting remainder of lab results. Monitor on

## 2021-07-03 NOTE — Discharge Instructions (Signed)
Get help right away if: You have abdominal or pelvic pain that is severe or gets worse. You cannot eat or drink without vomiting. You suddenly develop a fever or chills. Your menstrual period is much heavier than usual. 

## 2021-07-03 NOTE — ED Notes (Signed)
Patient transported to CT via stretcher.

## 2021-07-03 NOTE — ED Notes (Signed)
Pt resting quietly.  Warm blanket given.  IVF bolus still infusing

## 2021-07-03 NOTE — ED Notes (Signed)
Pt able to use bedside commode with assistance.

## 2021-07-03 NOTE — ED Provider Notes (Signed)
Patient here with sudden onset suprapubic abdominal pain/ RLQ pain. CT showed ovarian cysts. Patient has declined pain meds at this time. Plan to f/u on ultrasound findings and likely dispo with gyn f/u.  Patient [place] reviewed and shows complex R ovarian cyst. I reviewed the findings with the Patient. Patient sees Dr. Massie Bougie at [facility]  in [place]. I was able to relay the information to the staff at front desk who will pass info to Medical providers. They are also looking to get her seen as soon as possible and are trying to make room to see her in clinic in 2 days but will otherwise get her in next Monday. They will contact the patient later today. Patient given pain meds with improvement. Will dc with tramadol, zofran and celebrex.  Patient understands explicit need for further eval to r/o ovarian cancer. Return precautions given. PDMP reviewed during this encounter.    Arthor Captain, PA-C 07/03/21 1610    Tilden Fossa, MD 07/04/21 Corky Crafts

## 2021-07-03 NOTE — ED Triage Notes (Signed)
Pt c/o right lower quadrant abdominal pain for the last 3 hours. Pt denies nausea and vomiting.

## 2021-07-03 NOTE — ED Provider Notes (Signed)
[facility] EMERGENCY DEPARTMENT Provider Note   CSN: [id number] Arrival date & time: 07/03/21  0209     History Chief Complaint  Patient presents with   Abdominal Pain    Mary Russell is a 45 y.o. female.  Patient presents to the emergency department with a chief complaint of suprapubic abdominal pain.  She states that the pain also seems to move into her right lower quadrant.  It is worsened with movement and palpation.  She states that it has been gradually worsening over the past 3 hours.  She denies any nausea or vomiting.  Denies any urinary symptoms.  Denies fevers or chills.  Denies any treatment prior to arrival.  The history is provided by the patient. No language interpreter was used.      Past Medical History:  Diagnosis Date   Abdominal migraine    Anemia    Anxiety    Chronic migraine    Depression    Lumbar disc disease    Palpitations    Thyroid disease    Hoshimotos    Patient Active Problem List   Diagnosis Date Noted   Thyroid disease    Palpitations    Abdominal migraine    Chronic migraine    Lumbar disc disease    Lumbar radiculopathy 04/30/2020    Past Surgical History:  Procedure Laterality Date   CESAREAN SECTION  08/2004   06/2007   LUMBAR DISC SURGERY  03/2019   L5-S1     OB History   No obstetric history on file.     Family History  Problem Relation Age of Onset   Alcohol abuse Mother    COPD Mother    Hyperlipidemia Mother    Hypertension Mother    Diabetes Father    Depression Father    Depression Sister    COPD Maternal Grandmother    Heart disease Maternal Grandmother    Hyperlipidemia Maternal Grandmother    Alcohol abuse Maternal Grandfather    Heart disease Maternal Grandfather    Anxiety disorder Paternal Grandmother    Diabetes Paternal Grandmother    Anxiety disorder Paternal Grandfather     Social History   Tobacco Use   Smoking status: Never   Smokeless tobacco: Never   Vaping Use   Vaping Use: Never used  Substance Use Topics   Alcohol use: Not Currently   Drug use: Never    Home Medications Prior to Admission medications   Medication Sig Start Date End Date Taking? Authorizing Provider  acyclovir (ZOVIRAX) 400 MG tablet TAKE 1 TABLET (400 MG TOTAL) BY MOUTH 3 (THREE) TIMES DAILY AS NEEDED. 06/11/20   Waldon Merl, PA-C  Cholecalciferol (VITAMIN D3) 25 MCG (1000 UT) CAPS Take 1 capsule by mouth daily.    [provider]  [facility] PURELAX 17 GM/SCOOP powder Take by mouth as needed. 07/19/20   [provider]  EMGALITY 120 MG/ML SOAJ Inject 1 mL into the skin every 30 (thirty) days. 04/17/20   [provider]  gabapentin (NEURONTIN) 300 MG capsule Take 300 mg by mouth 3 (three) times daily. 08/02/20   [provider]  lidocaine (LIDODERM) 5 % 1 patch as needed. 08/02/20   [provider]  naproxen (NAPROSYN) 375 MG tablet Take by mouth as needed.    [provider]  NP THYROID 90 MG tablet Take 90 mg by mouth every morning. 04/26/20   [provider]  ondansetron (ZOFRAN-ODT) 4 MG disintegrating tablet Take 4  mg by mouth every 8 (eight) hours as needed. 07/19/20   [provider]  predniSONE (DELTASONE) 10 MG tablet as needed.    [provider]  SUMAtriptan (IMITREX) 100 MG tablet Take 100 mg by mouth 2 (two) times daily as needed. 05/02/20   [provider]  traMADol (ULTRAM) 50 MG tablet Take 1 tablet (50 mg total) by mouth daily as needed. 05/08/20   Waldon Merl, PA-C  venlafaxine XR (EFFEXOR-XR) 37.5 MG 24 hr capsule Take 37.5 mg by mouth daily. 04/18/20   [provider]  vitamin B-12 (CYANOCOBALAMIN) 1000 MCG tablet Take 1,000 mcg by mouth daily.    [provider]    Allergies    Amoxicillin and Other  Review of Systems   Review of Systems  All other systems reviewed and are negative.  Physical Exam Updated Vital Signs BP (!) 127/95 (BP  Location: Left Arm)   Pulse (!) 104   Temp 99.3 F (37.4 C)   Resp (!) 22   Ht 5' (1.524 m)   Wt 68 kg   SpO2 100%   BMI 29.29 kg/m   Physical Exam Vitals and nursing note reviewed.  Constitutional:      General: She is not in acute distress.    Appearance: She is well-developed.  HENT:     Head: Normocephalic and atraumatic.  Eyes:     Conjunctiva/sclera: Conjunctivae normal.  Cardiovascular:     Rate and Rhythm: Regular rhythm. Tachycardia present.     Heart sounds: No murmur heard. Pulmonary:     Effort: Pulmonary effort is normal. No respiratory distress.     Breath sounds: Normal breath sounds.  Abdominal:     Palpations: Abdomen is soft.     Tenderness: There is abdominal tenderness.     Comments: Suprapubic and right lower abdominal tenderness  Musculoskeletal:        General: No swelling.     Cervical back: Neck supple.  Skin:    General: Skin is warm and dry.     Capillary Refill: Capillary refill takes less than 2 seconds.  Neurological:     Mental Status: She is alert.  Psychiatric:        Mood and Affect: Mood normal.    ED Results / Procedures / Treatments   Labs (all labs ordered are listed, but only abnormal results are displayed) Labs Reviewed  COMPREHENSIVE METABOLIC PANEL - Abnormal; Notable for the following components:      Result Value   CO2 21 (*)    All other components within normal limits  LIPASE, BLOOD  CBC  URINALYSIS, ROUTINE W REFLEX MICROSCOPIC  I-STAT BETA HCG BLOOD, ED ([facility], [facility], [facility] ONLY)    EKG None  Radiology No results found.  Procedures Procedures   Medications Ordered in ED Medications - No data to display  ED Course  I have reviewed the triage vital signs and the nursing notes.  Pertinent labs & imaging results that were available during my care of the patient were reviewed by me and considered in my medical decision making (see chart for details).    MDM Rules/Calculators/A&P                            Patient here with RLQ pain.  Onset tonight.  CT negative for appy.  Radiology recommends [place] due to ovarian cysts.  This is pending.  Signed out to oncoming team, who will continue  care. Final Clinical Impression(s) / ED Diagnoses Final diagnoses:  None    Rx / DC Orders ED Discharge Orders     None        Roxy Horseman, PA-C 07/03/21 9518    Tilden Fossa, MD 07/03/21 [phone]

## 2021-07-11 ENCOUNTER — Ambulatory Visit: Payer: BC Managed Care – PPO | Admitting: Podiatry

## 2021-08-16 ENCOUNTER — Other Ambulatory Visit: Payer: Self-pay

## 2021-08-16 ENCOUNTER — Ambulatory Visit (INDEPENDENT_AMBULATORY_CARE_PROVIDER_SITE_OTHER): Payer: BC Managed Care – PPO | Admitting: Podiatry

## 2021-08-16 ENCOUNTER — Ambulatory Visit (INDEPENDENT_AMBULATORY_CARE_PROVIDER_SITE_OTHER): Payer: BC Managed Care – PPO

## 2021-08-16 VITALS — BP 117/75 | HR 86 | Temp 97.9°F

## 2021-08-16 DIAGNOSIS — S90851A Superficial foreign body, right foot, initial encounter: Secondary | ICD-10-CM

## 2021-08-16 DIAGNOSIS — M79671 Pain in right foot: Secondary | ICD-10-CM | POA: Diagnosis not present

## 2021-08-16 DIAGNOSIS — L989 Disorder of the skin and subcutaneous tissue, unspecified: Secondary | ICD-10-CM

## 2021-08-16 NOTE — Patient Instructions (Signed)
Keep the bandage on for 24 hours. At that time, remove and clean with soap and water. If it hurts or burns before 24 hours go ahead and remove the bandage and wash with soap and water. Keep the area clean. If there is any blistering cover with antibiotic ointment and a bandage. Monitor for any redness, drainage, or other signs of infection. Call the office if any are to occur. If you have any questions, please call the office at [phone].  

## 2021-08-20 NOTE — Progress Notes (Signed)
Subjective:   Patient ID: Tasia Catchings, female   DOB: 46 y.o.   MRN: [id number]   HPI 46 year old female presents the office today for concerns of a callus on the right foot which is been ongoing for about 1 year.  She said over the last 6 months it started become more uncomfortable and she feels like there is glass poking the foot.  She denies stepping any foreign objects or any recent injury or trauma.  No recent treatment.  Shoes with a heel make it hurt worse.  No swelling or redness or any drainage that she reports.  No other concerns.   Review of Systems  All other systems reviewed and are negative.  Past Medical History:  Diagnosis Date   Abdominal migraine    Anemia    Anxiety    Chronic migraine    Depression    Lumbar disc disease    Palpitations    Thyroid disease    Hoshimotos    Past Surgical History:  Procedure Laterality Date   CESAREAN SECTION  08/2004   06/2007   LUMBAR DISC SURGERY  03/2019   L5-S1     Current Outpatient Medications:    acyclovir (ZOVIRAX) 400 MG tablet, TAKE 1 TABLET (400 MG TOTAL) BY MOUTH 3 (THREE) TIMES DAILY AS NEEDED., Disp: 270 tablet, Rfl: 1   celecoxib (CELEBREX) 200 MG capsule, Take 1 capsule (200 mg total) by mouth 2 (two) times daily., Disp: 20 capsule, Rfl: 0   Cholecalciferol (VITAMIN D3) 25 MCG (1000 UT) CAPS, Take 1 capsule by mouth daily., Disp: , Rfl:    [employer] PURELAX 17 GM/SCOOP powder, Take by mouth as needed., Disp: , Rfl:    EMGALITY 120 MG/ML SOAJ, Inject 1 mL into the skin every 30 (thirty) days., Disp: , Rfl:    gabapentin (NEURONTIN) 300 MG capsule, Take 300 mg by mouth 3 (three) times daily., Disp: , Rfl:    lidocaine (LIDODERM) 5 %, 1 patch as needed., Disp: , Rfl:    NP THYROID 90 MG tablet, Take 90 mg by mouth every morning., Disp: , Rfl:    ondansetron (ZOFRAN) 4 MG tablet, Take 1 tablet (4 mg total) by mouth every 8 (eight) hours as needed for nausea or vomiting., Disp: 10 tablet, Rfl: 0   ondansetron  (ZOFRAN-ODT) 4 MG disintegrating tablet, Take 4 mg by mouth every 8 (eight) hours as needed., Disp: , Rfl:    predniSONE (DELTASONE) 10 MG tablet, as needed., Disp: , Rfl:    SUMAtriptan (IMITREX) 100 MG tablet, Take 100 mg by mouth 2 (two) times daily as needed., Disp: , Rfl:    traMADol (ULTRAM) 50 MG tablet, Take 1 tablet (50 mg total) by mouth every 6 (six) hours as needed., Disp: 20 tablet, Rfl: 0   venlafaxine XR (EFFEXOR-XR) 37.5 MG 24 hr capsule, Take 37.5 mg by mouth daily., Disp: , Rfl:    vitamin B-12 (CYANOCOBALAMIN) 1000 MCG tablet, Take 1,000 mcg by mouth daily., Disp: , Rfl:   Allergies  Allergen Reactions   Amoxicillin Hives   Other Hives          Objective:  Physical Exam  General: AAO x3, NAD  Dermatological: Annular hyperkeratotic lesion present submetatarsal on the right foot.  Upon debridement there is no ongoing ulceration drainage or signs of infection no evidence of foreign body noted.  Vascular: Dorsalis Pedis artery and Posterior Tibial artery pedal pulses are 2/4 bilateral with immedate capillary fill time.There is no pain with calf  compression, swelling, warmth, erythema.   Neruologic: Grossly intact via light touch bilateral.   Musculoskeletal: Tenderness on the hyperkeratotic lesion prior to debridement.  No other areas of discomfort.  Muscular strength 5/5 in all groups tested bilateral.  Gait: Unassisted, Nonantalgic.       Assessment:   46 year old female painful skin lesion right foot     Plan:  -Treatment options discussed including all alternatives, risks, and complications -Etiology of symptoms were discussed -X-rays were obtained and reviewed with the patient.  No evidence of acute fracture, foreign body or calcifications present. -I sharply debrided the hyperkeratotic lesion without any complications or bleeding.  Cleaned skin with alcohol and a pad was placed followed by salicylic acid and a bandage.  Post procedure instructions  discussed.  Monitor for any signs or symptoms of infection.    Trula Slade DPM

## 2021-09-10 ENCOUNTER — Other Ambulatory Visit: Payer: Self-pay | Admitting: Podiatry

## 2021-09-10 DIAGNOSIS — S90851A Superficial foreign body, right foot, initial encounter: Secondary | ICD-10-CM

## 2022-10-24 IMAGING — CT CT ABD-PELV W/ CM
2 of 5 series · 16 of 46 positions shown, 18 images · IV contrast (omnipaque)
Comparison: None.

CLINICAL DATA: Right lower quadrant pain.

EXAM:
CT ABDOMEN AND PELVIS WITH CONTRAST
TECHNIQUE: Multidetector CT imaging of the abdomen and pelvis was performed
using the standard protocol following bolus administration of
intravenous contrast.
CONTRAST:  80mL OMNIPAQUE IOHEXOL 300 MG/ML  SOLN

[Series 3: abdomen 5.0 · axial · 0.90mm/px · z∈[+808,+1158]mm · 13 of 81 slices shown, 15 images]
[im 6/81  soft-tissue]
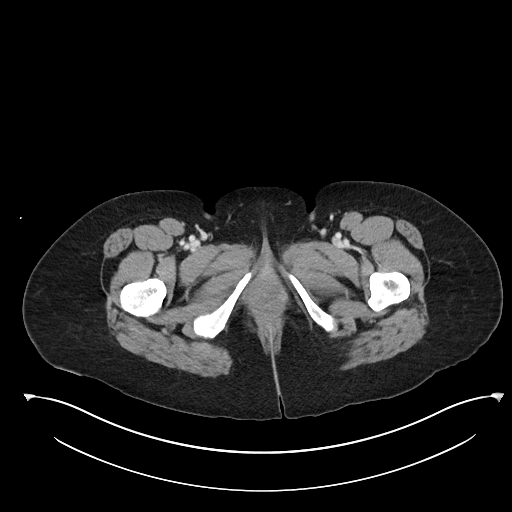
[im 6/81  bone]
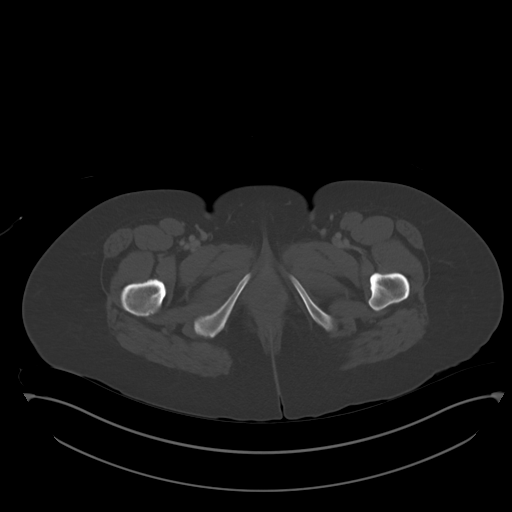
[im 11/81  soft-tissue]
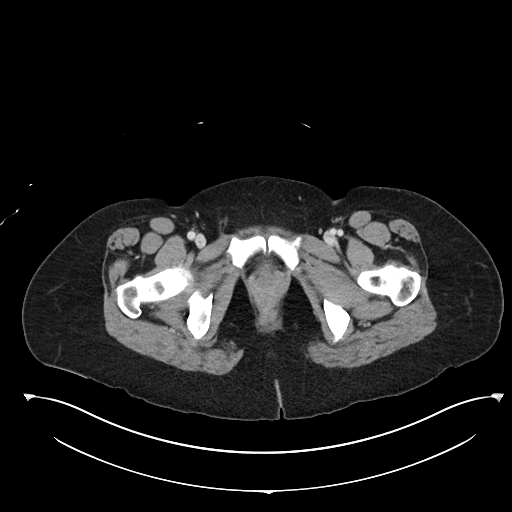
[im 16/81  soft-tissue]
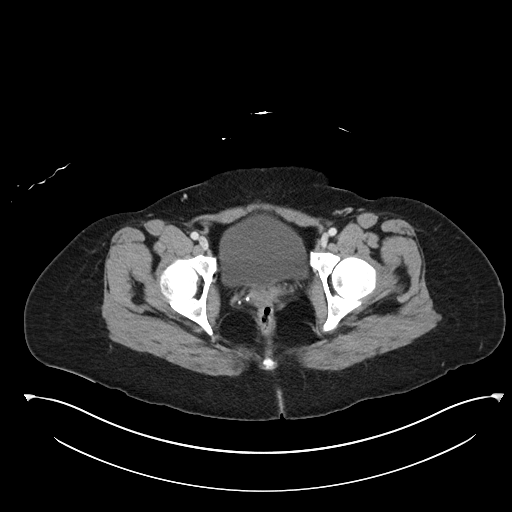
[im 26/81  soft-tissue]
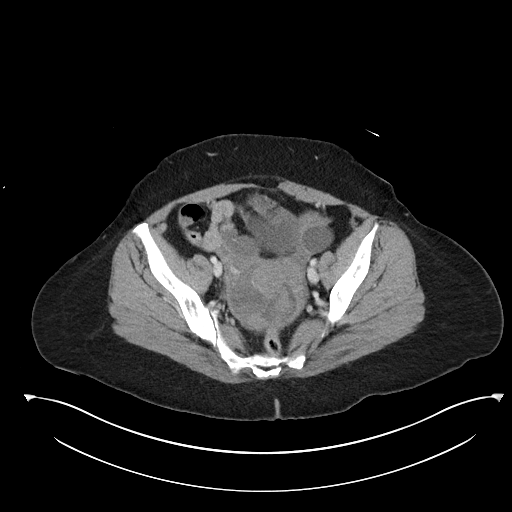
[im 31/81  soft-tissue]
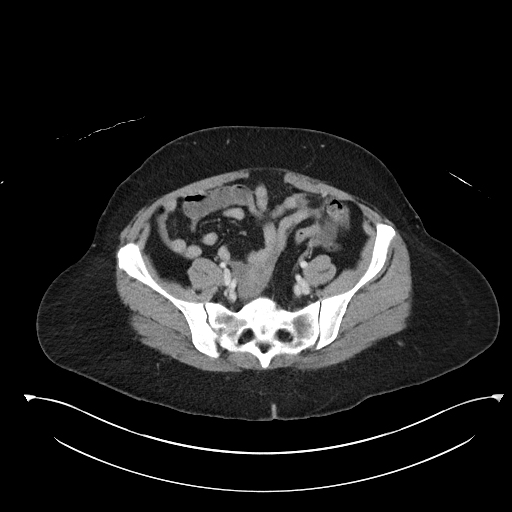
[im 36/81  soft-tissue]
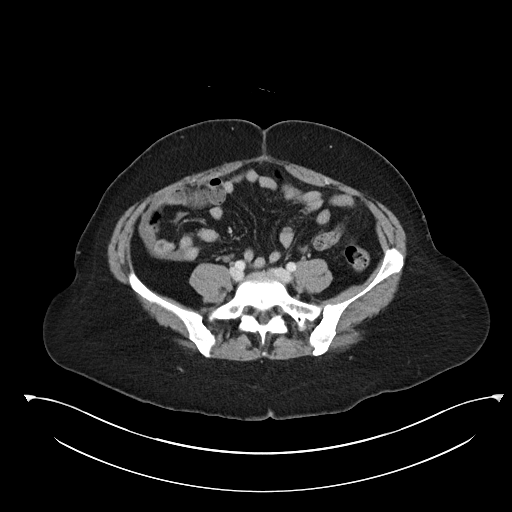
[im 41/81  soft-tissue]
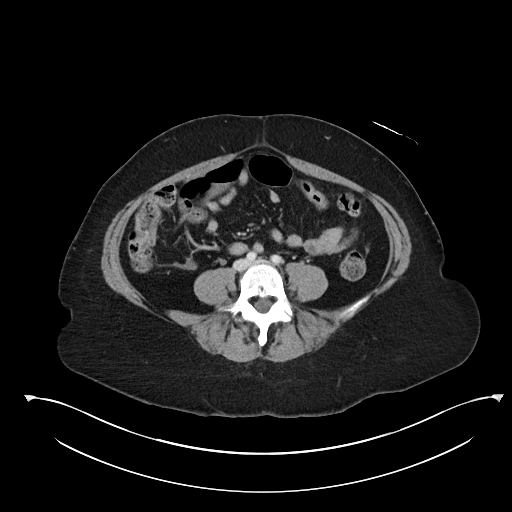
[im 46/81  soft-tissue]
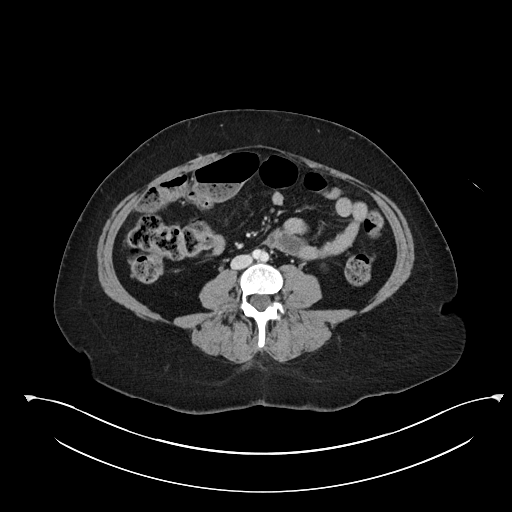
[im 51/81  soft-tissue]
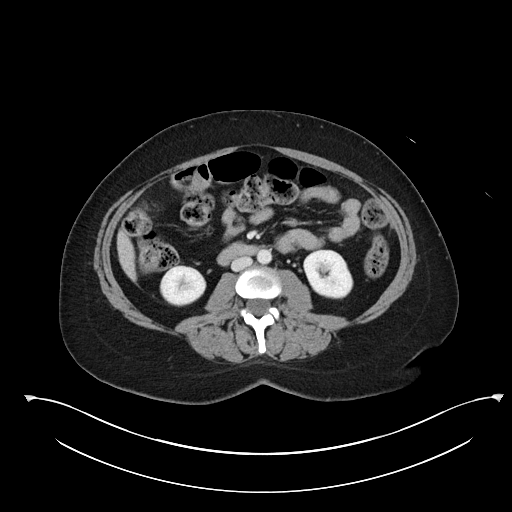
[im 51/81  bone]
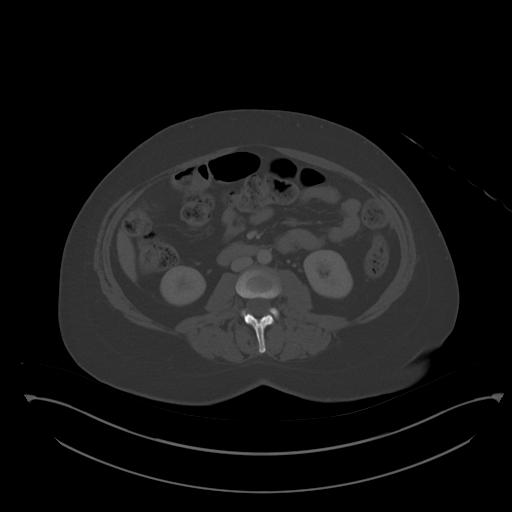
[im 56/81  soft-tissue]
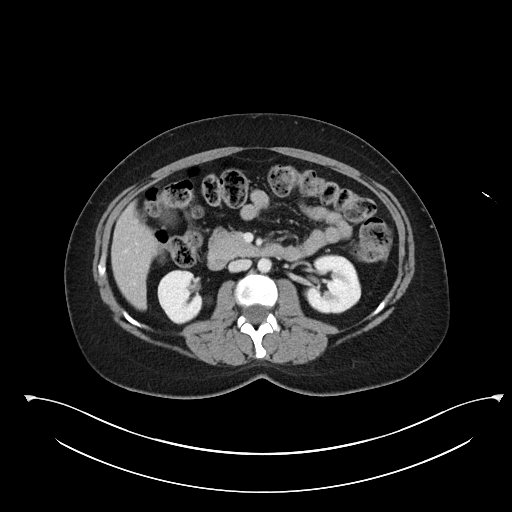
[im 66/81  soft-tissue]
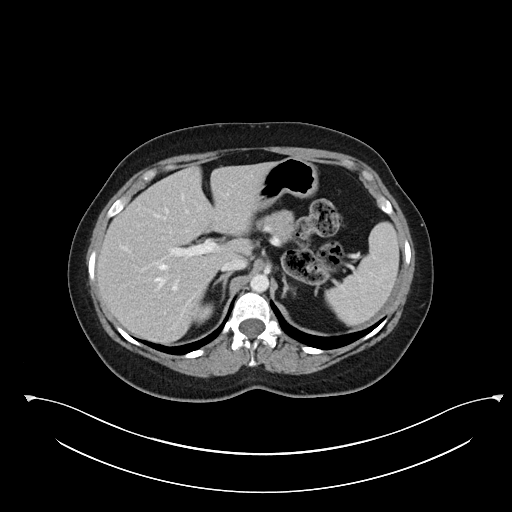
[im 71/81  soft-tissue]
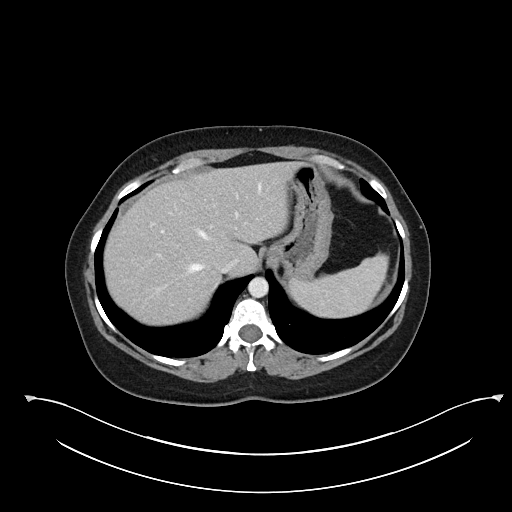
[im 76/81  soft-tissue]
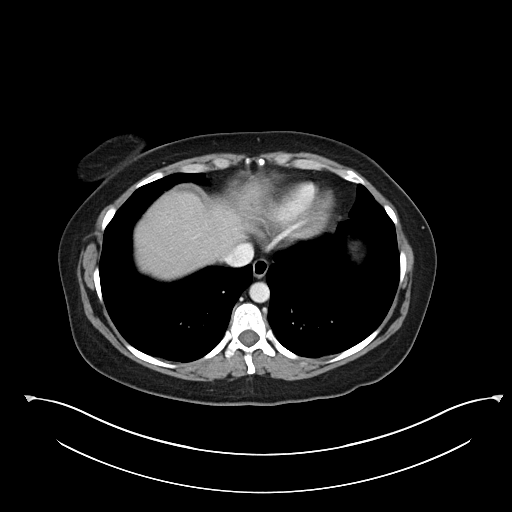

[Series 6: abdomen 3.0 mpr cor · coronal · 0.82mm/px · 3 of 96 slices shown]
[im 32/96  soft-tissue]
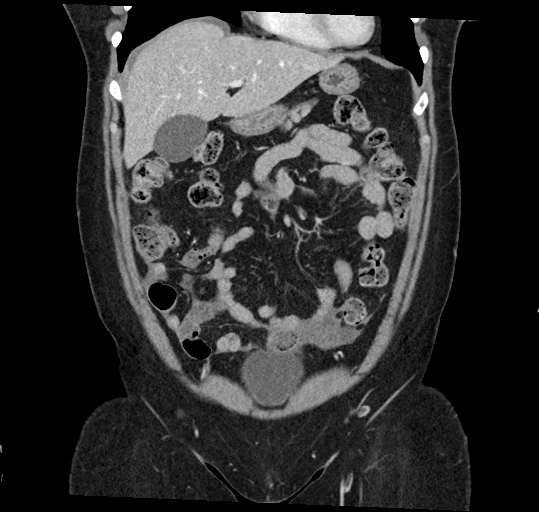
[im 43/96  soft-tissue]
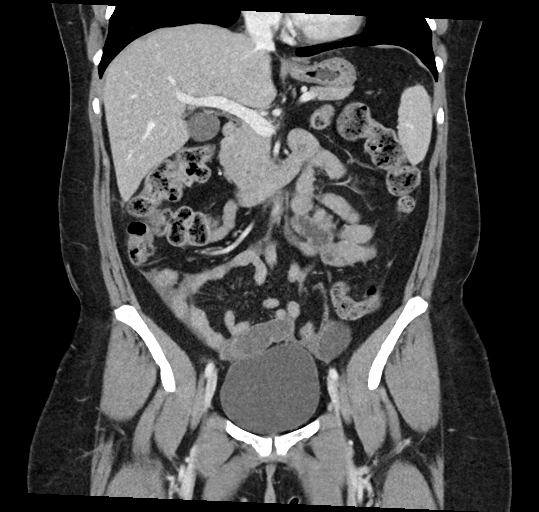
[im 53/96  soft-tissue]
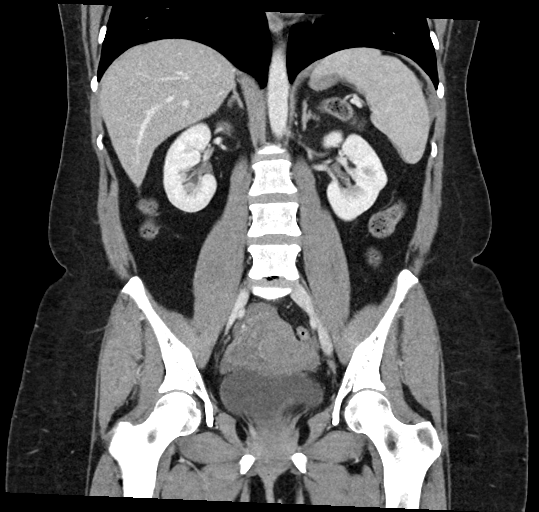

[16 of 46 positions shown; findings below may reference images not displayed]

FINDINGS: Lower chest: Minimal atelectasis at the lung bases. The heart is
normal in size.

Hepatobiliary: No focal liver abnormality is seen. No gallstones,
gallbladder wall thickening, or biliary dilatation.

Pancreas: Unremarkable. No pancreatic ductal dilatation or
surrounding inflammatory changes.

Spleen: Normal in size without focal abnormality.

Adrenals/Urinary Tract: Adrenal glands are unremarkable. There is a
2 mm calculus in the mid left kidney. No hydronephrosis is seen
bilaterally. Bladder is unremarkable.

Stomach/Bowel: No bowel obstruction, free air or pneumatosis. The
appendix is normal in caliber, best seen on coronal image 38. The
stomach is unremarkable. No focal bowel wall thickening is
identified

Vascular/Lymphatic: No significant vascular findings are present. No
enlarged abdominal or pelvic lymph nodes.

Reproductive: The uterus is in-situ and contains isodense masses,
likely fibroids. Numerous cystic structures are present in the
ovaries bilaterally, measuring up to 2.9 cm on the left and 3.4 cm
on the right.

Other: A small amount of free fluid is present in the mesenteric
folds in the right lower quadrant. There is a fat containing
umbilical hernia.

Musculoskeletal: No acute osseous abnormality.
IMPRESSION: 1. No bowel obstruction or free air.  Normal appendix.
2. Left renal calculus.  No obstructive uropathy bilaterally.
3. Multiple cysts in the ovaries bilaterally as described above.
Ultrasound is recommended for further evaluation.
4. Small amount of free fluid in the mesentery in the right lower
quadrant.

## 2022-10-24 IMAGING — US US PELVIS COMPLETE TRANSABD/TRANSVAG W DUPLEX
1 series · 13 of 25 positions shown · non-contrast
Comparison: Abdominal CT from earlier today

CLINICAL DATA: Right lower quadrant pain

EXAM:
TRANSABDOMINAL AND TRANSVAGINAL ULTRASOUND OF PELVIS
DOPPLER ULTRASOUND OF OVARIES
TECHNIQUE: Both transabdominal and transvaginal ultrasound examinations of the
pelvis were performed. Transabdominal technique was performed for
global imaging of the pelvis including uterus, ovaries, adnexal
regions, and pelvic cul-de-sac.
It was necessary to proceed with endovaginal exam following the
transabdominal exam to visualize the ovaries. Color and duplex
Doppler ultrasound was utilized to evaluate blood flow to the
ovaries.

[Series 1: us pelvic complete w transvaginal and torsion righ · 81 acquisitions, 13 frames shown]
[im 1/81]
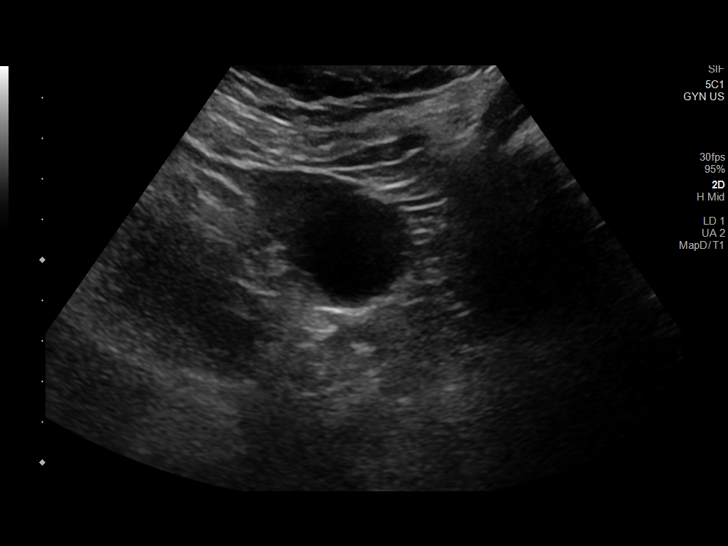
[im 7/81]
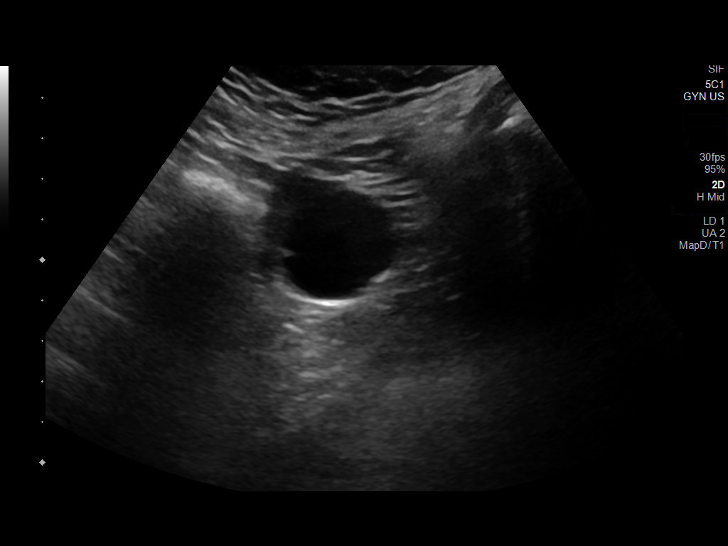
[im 14/81]
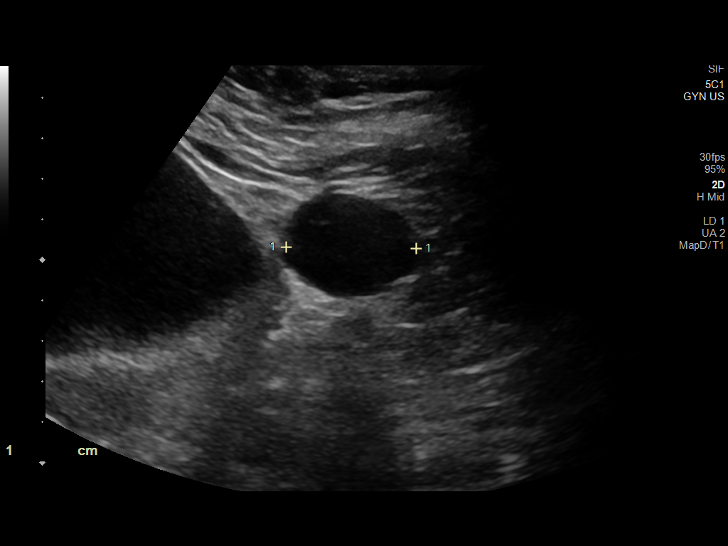
[im 21/81]
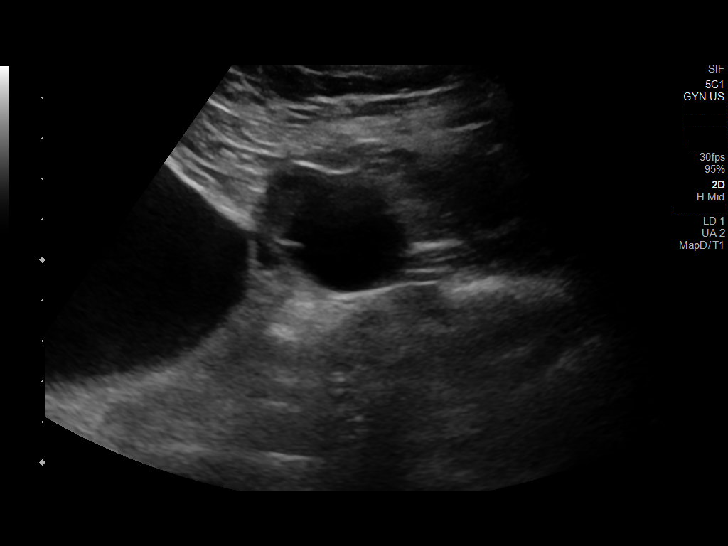
[im 27/81]
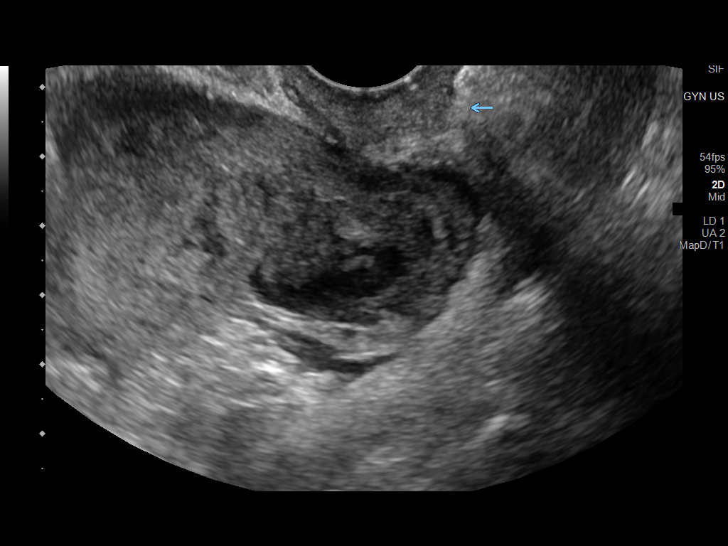
[im 34/81]
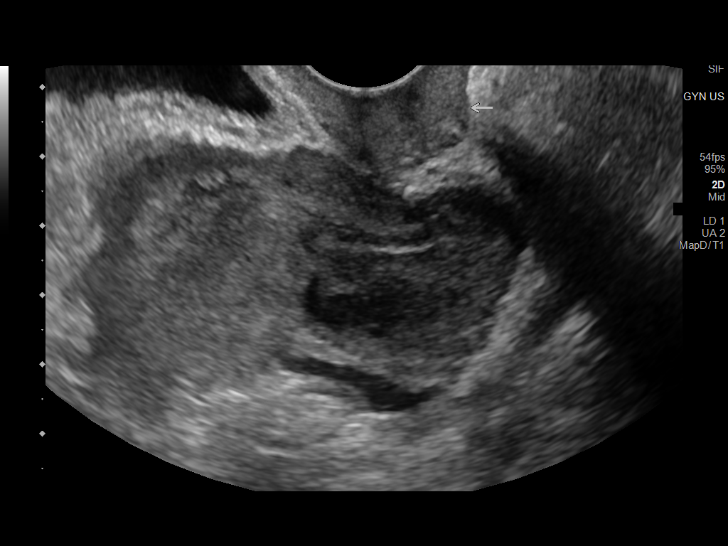
[im 41/81]
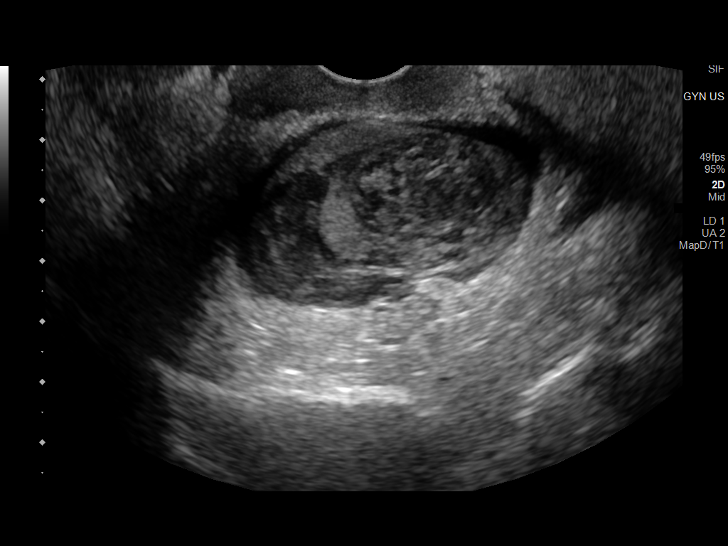
[im 47/81]
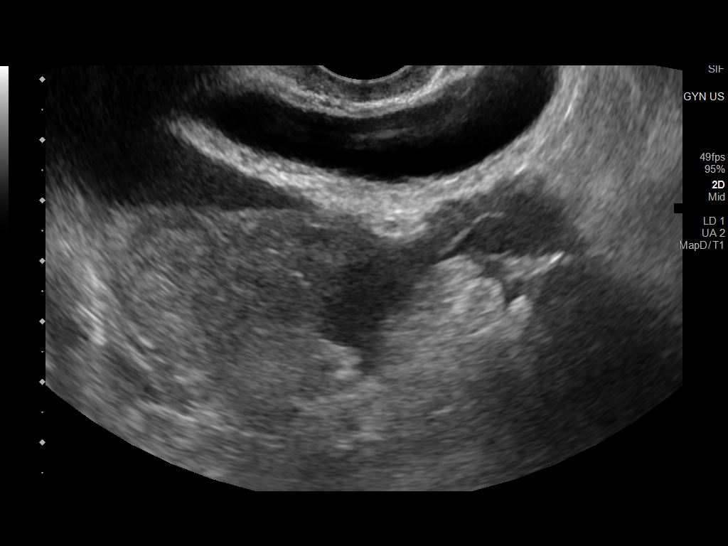
[im 54/81]
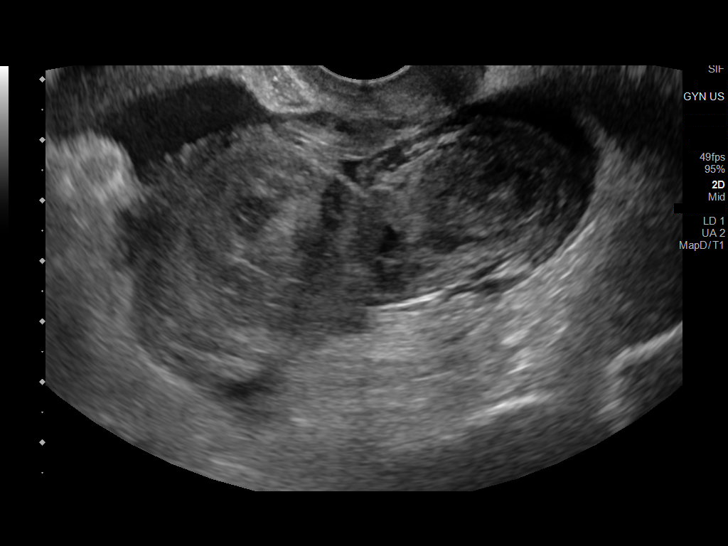
[im 61/81]
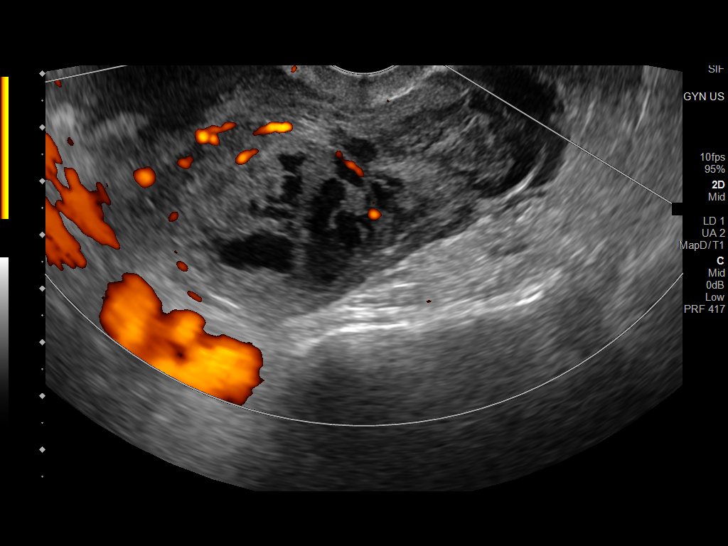
[im 67/81]
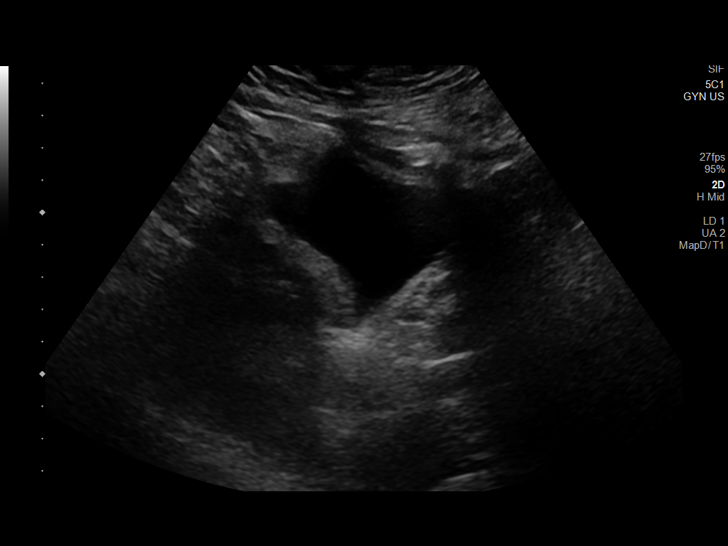
[im 74/81]
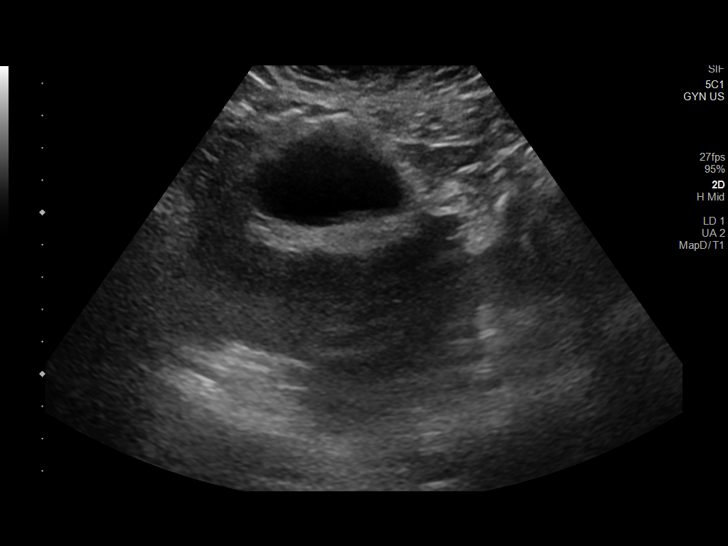
[im 81/81]
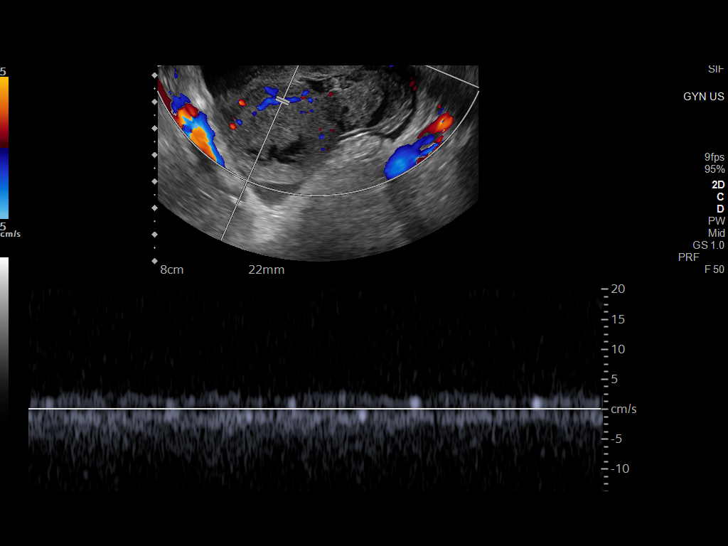

[13 of 25 positions shown; findings below may reference images not displayed]

FINDINGS: Uterus

Surgically absent

Right ovary

A normal right ovary is not visualized, rather there is a
heterogeneous solid and cystic mass measuring up to 8 x 4.5 x
cm. Avascular mid level echoes internally may reflect hemorrhagic
components.

Left ovary

Measurements: 44 x 30 x 38 mm = volume: 26 mL.  No adnexal mass.

Pulsed Doppler evaluation of both ovaries (with presumed ovarian in
sin a shin on the right) demonstrates normal low-resistance arterial
and venous waveforms.
IMPRESSION: 1. Complex solid and cystic right adnexal mass measuring up to 8 cm.
Recommend gynecologic surgery consultation.
2. Unremarkable left ovary.
3. Bilateral adnexal blood flow is present.

## 2022-11-19 ENCOUNTER — Ambulatory Visit (HOSPITAL_BASED_OUTPATIENT_CLINIC_OR_DEPARTMENT_OTHER): Payer: BC Managed Care – PPO | Attending: Orthopedic Surgery | Admitting: Physical Therapy

## 2022-11-19 DIAGNOSIS — M5459 Other low back pain: Secondary | ICD-10-CM | POA: Insufficient documentation

## 2022-11-19 DIAGNOSIS — R262 Difficulty in walking, not elsewhere classified: Secondary | ICD-10-CM | POA: Diagnosis present

## 2022-11-19 DIAGNOSIS — R293 Abnormal posture: Secondary | ICD-10-CM | POA: Diagnosis present

## 2022-11-19 DIAGNOSIS — M6281 Muscle weakness (generalized): Secondary | ICD-10-CM | POA: Insufficient documentation

## 2022-11-19 NOTE — Therapy (Unsigned)
OUTPATIENT PHYSICAL THERAPY EVALUATION   Patient Name: Mary Russell MRN: [id number] DOB:Jan 25, 1976, 47 y.o., female Today's Date: 11/20/2022  END OF SESSION:  PT End of Session - 11/20/22 1038     Visit Number 1    Number of Visits 33    Date for PT Re-Evaluation 03/13/23    PT Start Time 1315    PT Stop Time 1400    PT Time Calculation (min) 45 min    Activity Tolerance Patient tolerated treatment well    Behavior During Therapy [facility] for tasks assessed/performed             Past Medical History:  Diagnosis Date   Abdominal migraine    Anemia    Anxiety    Chronic migraine    Depression    Lumbar disc disease    Palpitations    Thyroid disease    Hoshimotos   Past Surgical History:  Procedure Laterality Date   CESAREAN SECTION  08/2004   06/2007   LUMBAR DISC SURGERY  03/2019   L5-S1   Patient Active Problem List   Diagnosis Date Noted   Thyroid disease    Palpitations    Abdominal migraine    Chronic migraine    Lumbar disc disease    Lumbar radiculopathy 04/30/2020     REFERRING PROVIDER:  Venita Lick, MD   REFERRING DIAG: M54.51 (ICD-10-CM) - Vertebrogenic low back pain   Rationale for Evaluation and Treatment: Rehabilitation  THERAPY DIAG:  Other low back pain  Abnormal posture  Difficulty in walking, not elsewhere classified  Muscle weakness (generalized)  ONSET DATE: approx 2020   SUBJECTIVE:                                                                                                                                                                                           SUBJECTIVE STATEMENT: 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 I like doing water aerobics and do not hurt at all during, sometimes I hurt after. A lot of my chronic pain is around the bra line. If I do bike/treadmil I will get leg pain at night time sometimes. Trying to get weight down so I do the  recumbant bike at home but very little due to nerve pain.  I do Emgality monthly for migraines.  Had herniation 2x and diskectomy. Initial injury 4 yr ago- bent over while at work and felt like something was wrong, surgery 3 yr ago after hurting it while mopping.  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  PERTINENT HISTORY:  Abdominal migraine, history of L5-S1 lumbar surgery, history of C-section  PAIN:  Are you having pain? Yes: NPRS scale: Minimal/10 Pain location: Thoracolumbar region Pain description: Aching gets worse throughout the day Aggravating factors: Increase in pain over time Relieving factors: Lay supine  PRECAUTIONS: None  WEIGHT BEARING RESTRICTIONS: No  FALLS:  Has patient fallen in last 6 months? 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    OBJECTIVE:   DIAGNOSTIC FINDINGS:  No reports available at evaluation  PATIENT SURVEYS:  FOTO 47  COGNITIVE STATUS: Within functional limits for tasks assessed   RED FLAGS: None    SENSATION: WFL  POSTURE:  rounded shoulders, increased lumbar lordosis, and decreased thoracic kyphosis Lt PSIS elevation, in supine: Lt post innom rotation resulting in functional LLD (Rt longer)    Body Part #1 Lumbar   LE MMT LOWER EXTREMITY MMT: Not appropriate to test at evaluation due to notable pelvic rotation  MMT Right eval Left eval  Hip flexion    Hip extension    Hip abduction    Hip adduction    Hip internal rotation    Hip external rotation    Knee flexion    Knee extension    Ankle dorsiflexion    Ankle plantarflexion    Ankle inversion    Ankle eversion     (Blank rows = not tested)    ROM:  Patient demonstrates range of motion within functional limits but is very guarded in forward flexion utilizing hands as a brace on her legs   TREATMENT:                                                                                                                               Treatment                            EVAL 11/19/22:  Self correction for Lt post innom Seated HSS, piriformis stretch Seated trunk while out with Swiss ball Shoulder external rotation with red Thera-Band Resting seated posture     PATIENT EDUCATION:  Education details: Teacher, musicAnatomy of condition, POC, HEP, exercise form/rationale Person educated: Patient Education method: Programmer, multimediaxplanation, Demonstration, Tactile cues, Verbal cues, and Handouts Education comprehension: verbalized understanding, returned demonstration, verbal cues required, tactile cues required, and needs further education  HOME EXERCISE PROGRAM: [id number]   ASSESSMENT:  CLINICAL IMPRESSION: Patient is a 47y.o. female who was seen today for physical therapy evaluation and treatment for chronic low back pain.  Patient is anxious to return to exercise and work depending on outcome from PT. patient is dominant to lumbar extensors creating excessive  tension.  She is also larger chested and we discussed bra supports as options as well as thoracic postural support braces.  Patient will benefit from skilled physical therapy in order to improve core stabilization for lumbopelvic support.    OBJECTIVE IMPAIRMENTS: decreased activity tolerance, difficulty walking, decreased strength, increased muscle spasms, improper body mechanics, postural dysfunction, and pain.   ACTIVITY LIMITATIONS: carrying, lifting, bending, sitting, standing, stairs, bed mobility, locomotion level, and caring for others  PARTICIPATION LIMITATIONS: meal prep, cleaning, laundry, driving, shopping, community activity, occupation, and yard work  PERSONAL FACTORS: Past/current experiences and  Time since onset of injury/illness/exacerbation are also affecting patient's functional outcome.   REHAB POTENTIAL: Good  CLINICAL DECISION MAKING: Evolving/moderate complexity  EVALUATION COMPLEXITY: Moderate   GOALS: Goals reviewed with patient? Yes  SHORT TERM GOALS: Target date: 5/10  Compliance with home exercise program as it has been established in the short-term Baseline: Goal status: INITIAL  2.  Independent and utilization of self-correction techniques for pelvic rotation correction Baseline:  Goal status: INITIAL    LONG TERM GOALS: Target date: POC date  Will meet Foto goal Baseline:  Goal status: INITIAL  2.  Bilateral hip abduction strength within age-appropriate range Baseline: Not appropriate to test at evaluation due to notable innominate rotation indicating poor muscular stability Goal status: INITIAL  3.  Able to ride the bike without radicular symptoms Baseline:  Goal status: INITIAL  4.  Able to utilize stretches, exercises, and self-care techniques to complete functional daily activities without limitation by pain Baseline:  Goal status: INITIAL    PLAN:  PT FREQUENCY: 1-2x/week  PT DURATION: other: Through POC date  PLANNED INTERVENTIONS: Therapeutic exercises, Therapeutic activity, Neuromuscular re-education, Balance training, Gait training, Patient/Family education, Self Care, Joint mobilization, Stair training, Aquatic Therapy, Dry Needling, Electrical stimulation, Spinal mobilization, Cryotherapy, Moist heat, Taping, Ultrasound, Ionotophoresis 4mg /ml Dexamethasone, Manual therapy, and Re-evaluation.  PLAN FOR NEXT SESSION: Dry needling, core activation, lumbar multifidi stabilization   Jones Viviani C. Andrya Roppolo PT, DPT 11/20/22 5:21 PM

## 2022-11-20 ENCOUNTER — Other Ambulatory Visit: Payer: Self-pay

## 2022-11-20 ENCOUNTER — Encounter (HOSPITAL_BASED_OUTPATIENT_CLINIC_OR_DEPARTMENT_OTHER): Payer: Self-pay | Admitting: Physical Therapy

## 2022-11-22 ENCOUNTER — Encounter (HOSPITAL_BASED_OUTPATIENT_CLINIC_OR_DEPARTMENT_OTHER): Payer: Self-pay | Admitting: Physical Therapy

## 2022-12-02 ENCOUNTER — Encounter (HOSPITAL_BASED_OUTPATIENT_CLINIC_OR_DEPARTMENT_OTHER): Payer: Self-pay | Admitting: Physical Therapy

## 2022-12-02 ENCOUNTER — Ambulatory Visit (HOSPITAL_BASED_OUTPATIENT_CLINIC_OR_DEPARTMENT_OTHER): Payer: BC Managed Care – PPO | Admitting: Physical Therapy

## 2022-12-02 DIAGNOSIS — M5459 Other low back pain: Secondary | ICD-10-CM | POA: Diagnosis not present

## 2022-12-02 DIAGNOSIS — M6281 Muscle weakness (generalized): Secondary | ICD-10-CM

## 2022-12-02 DIAGNOSIS — R262 Difficulty in walking, not elsewhere classified: Secondary | ICD-10-CM

## 2022-12-02 DIAGNOSIS — R293 Abnormal posture: Secondary | ICD-10-CM

## 2022-12-02 NOTE — Therapy (Signed)
OUTPATIENT PHYSICAL THERAPY treatment   Patient Name: Mary Russell MRN: [id number] DOB:[birth year], 47 y.o., female Today's Date: 12/02/2022  END OF SESSION:  PT End of Session - 12/02/22 1235     Visit Number 2    Number of Visits 33    Date for PT Re-Evaluation 03/13/23    PT Start Time 1230    PT Stop Time 1300    PT Time Calculation (min) 30 min    Activity Tolerance Patient tolerated treatment well    Behavior During Therapy [facility] for tasks assessed/performed              Past Medical History:  Diagnosis Date   Abdominal migraine    Anemia    Anxiety    Chronic migraine    Depression    Lumbar disc disease    Palpitations    Thyroid disease    Hoshimotos   Past Surgical History:  Procedure Laterality Date   CESAREAN SECTION  08/2004   06/2007   LUMBAR DISC SURGERY  03/2019   L5-S1   Patient Active Problem List   Diagnosis Date Noted   Thyroid disease    Palpitations    Abdominal migraine    Chronic migraine    Lumbar disc disease    Lumbar radiculopathy 04/30/2020     REFERRING PROVIDER:  Venita Lick, MD   REFERRING DIAG: M54.51 (ICD-10-CM) - Vertebrogenic low back pain   Rationale for Evaluation and Treatment: Rehabilitation  THERAPY DIAG:  Other low back pain  Abnormal posture  Difficulty in walking, not elsewhere classified  Muscle weakness (generalized)  ONSET DATE: approx 2020   SUBJECTIVE:                                                                                                                                                                                           SUBJECTIVE STATEMENT: Climbed a lot of stairs carrying a dance competition bag. I was able to do it! More soreness in upper back. I think my back was bad because of the roll out with side bend last time.   PERTINENT HISTORY:  Abdominal migraine, history of L5-S1 lumbar surgery, history of C-section  PAIN:  Are you having pain? Yes: NPRS scale:  Minimal/10 Pain location: Thoracolumbar region Pain description: Aching gets worse throughout the day Aggravating factors: Increase in pain over time Relieving factors: Lay supine  PRECAUTIONS: None  WEIGHT BEARING RESTRICTIONS: No  FALLS:  Has patient fallen in last 6 months? 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    OBJECTIVE:   DIAGNOSTIC FINDINGS:  No reports available at evaluation  PATIENT SURVEYS:  FOTO 47  COGNITIVE STATUS: Within functional limits for tasks assessed   RED FLAGS: None    SENSATION: WFL  POSTURE:  rounded shoulders, increased lumbar lordosis, and decreased thoracic kyphosis Lt PSIS elevation, in supine: Lt post innom rotation resulting in functional LLD (Rt longer)    Body Part #1 Lumbar   LE MMT LOWER EXTREMITY MMT: Not appropriate to test at evaluation due to notable pelvic rotation  MMT Right eval Left eval  Hip flexion    Hip extension    Hip abduction    Hip adduction    Hip internal rotation    Hip external rotation    Knee flexion    Knee extension    Ankle dorsiflexion    Ankle plantarflexion    Ankle inversion    Ankle eversion     (Blank rows = not tested)    ROM: Patient demonstrates range of motion within functional limits but is very guarded in forward flexion utilizing hands as a brace on her legs   TREATMENT:                                                                                                                               Treatment                            12/02/22:  Trigger Point Dry Needling, Manual Therapy Treatment:  Initial or subsequent education regarding Trigger Point Dry Needling: Initial Did patient give consent to treatment with Trigger  Point Dry Needling: Yes TPDN with skilled palpation and monitoring followed by STM to the following muscles: bil upper trap, levator, rhomboids  Manual prone rib mob grade 2 Prone scap retraction Practice with theracane   Treatment                            EVAL 11/19/22:  Self correction for Lt post innom Seated HSS, piriformis stretch Seated trunk while out with Swiss ball Shoulder external rotation with red Thera-Band Resting seated posture     PATIENT EDUCATION:  Education details: Teacher, music of condition, POC, HEP, exercise form/rationale Person educated: Patient Education method: Explanation, Demonstration, Tactile cues, Verbal cues, and Handouts Education comprehension: verbalized understanding, returned demonstration, verbal cues required, tactile cues required, and needs further education  HOME EXERCISE PROGRAM: [id number]   ASSESSMENT:  CLINICAL IMPRESSION: Limited treatment today due to flare up that occurred after last visit. She is still doing aquatic exercises and working to figure out a proper working level to avoid next day soreness. Good that she improved tolerance to stairs and carrying with less low back pain or  radicular symptoms.    OBJECTIVE IMPAIRMENTS: decreased activity tolerance, difficulty walking, decreased strength, increased muscle spasms, improper body mechanics, postural dysfunction, and pain.   ACTIVITY LIMITATIONS: carrying, lifting, bending, sitting, standing, stairs, bed mobility, locomotion level, and caring for others  PARTICIPATION LIMITATIONS: meal prep, cleaning, laundry, driving, shopping, community activity, occupation, and yard work  PERSONAL FACTORS: Past/current experiences and Time since onset of injury/illness/exacerbation are also affecting patient's functional outcome.   REHAB POTENTIAL: Good  CLINICAL DECISION MAKING: Evolving/moderate complexity  EVALUATION COMPLEXITY: Moderate   GOALS: Goals reviewed with patient?  Yes  SHORT TERM GOALS: Target date: 5/10  Compliance with home exercise program as it has been established in the short-term Baseline: Goal status: INITIAL  2.  Independent and utilization of self-correction techniques for pelvic rotation correction Baseline:  Goal status: INITIAL    LONG TERM GOALS: Target date: POC date  Will meet Foto goal Baseline:  Goal status: INITIAL  2.  Bilateral hip abduction strength within age-appropriate range Baseline: Not appropriate to test at evaluation due to notable innominate rotation indicating poor muscular stability Goal status: INITIAL  3.  Able to ride the bike without radicular symptoms Baseline:  Goal status: INITIAL  4.  Able to utilize stretches, exercises, and self-care techniques to complete functional daily activities without limitation by pain Baseline:  Goal status: INITIAL    PLAN:  PT FREQUENCY: 1-2x/week  PT DURATION: other: Through POC date  PLANNED INTERVENTIONS: Therapeutic exercises, Therapeutic activity, Neuromuscular re-education, Balance training, Gait training, Patient/Family education, Self Care, Joint mobilization, Stair training, Aquatic Therapy, Dry Needling, Electrical stimulation, Spinal mobilization, Cryotherapy, Moist heat, Taping, Ultrasound, Ionotophoresis 4mg /ml Dexamethasone, Manual therapy, and Re-evaluation.  PLAN FOR NEXT SESSION: Dry needling, core activation, lumbar multifidi stabilization   Glori Machnik C. Trystyn Dolley PT, DPT 12/02/22 2:23 PM

## 2022-12-03 ENCOUNTER — Encounter (HOSPITAL_BASED_OUTPATIENT_CLINIC_OR_DEPARTMENT_OTHER): Payer: Self-pay | Admitting: Physical Therapy

## 2022-12-03 ENCOUNTER — Ambulatory Visit (HOSPITAL_BASED_OUTPATIENT_CLINIC_OR_DEPARTMENT_OTHER): Payer: BC Managed Care – PPO | Admitting: Physical Therapy

## 2022-12-03 DIAGNOSIS — R293 Abnormal posture: Secondary | ICD-10-CM

## 2022-12-03 DIAGNOSIS — M5459 Other low back pain: Secondary | ICD-10-CM | POA: Diagnosis not present

## 2022-12-03 DIAGNOSIS — M6281 Muscle weakness (generalized): Secondary | ICD-10-CM

## 2022-12-03 DIAGNOSIS — R262 Difficulty in walking, not elsewhere classified: Secondary | ICD-10-CM

## 2022-12-03 NOTE — Therapy (Signed)
OUTPATIENT PHYSICAL THERAPY treatment   Patient Name: Mary Russell MRN: [id number] DOB:1976-06-12, 47 y.o., female Today's Date: 12/03/2022  END OF SESSION:  PT End of Session - 12/03/22 1647     Visit Number 3    Number of Visits 33    Date for PT Re-Evaluation 03/13/23    PT Start Time 1416    PT Stop Time 1500    PT Time Calculation (min) 44 min    Activity Tolerance Patient tolerated treatment well    Behavior During Therapy [facility] for tasks assessed/performed               Past Medical History:  Diagnosis Date   Abdominal migraine    Anemia    Anxiety    Chronic migraine    Depression    Lumbar disc disease    Palpitations    Thyroid disease    Hoshimotos   Past Surgical History:  Procedure Laterality Date   CESAREAN SECTION  08/2004   06/2007   LUMBAR DISC SURGERY  03/2019   L5-S1   Patient Active Problem List   Diagnosis Date Noted   Thyroid disease    Palpitations    Abdominal migraine    Chronic migraine    Lumbar disc disease    Lumbar radiculopathy 04/30/2020     REFERRING PROVIDER:  Venita Lick, MD   REFERRING DIAG: M54.51 (ICD-10-CM) - Vertebrogenic low back pain   Rationale for Evaluation and Treatment: Rehabilitation  THERAPY DIAG:  Other low back pain  Abnormal posture  Difficulty in walking, not elsewhere classified  Muscle weakness (generalized)  ONSET DATE: approx 2020   SUBJECTIVE:                                                                                                                                                                                           SUBJECTIVE STATEMENT: "Do water aerobic a few times a week and half the time I am hurting at mid thoracic up towards cervical pain afterwards.  Hoping you can show me what to do"  PERTINENT HISTORY:  Abdominal migraine, history of L5-S1 lumbar surgery, history of C-section  PAIN:  Are you having pain? Yes: NPRS scale: Minimal 6/10 Pain location:  Thoracolumbar region Pain description: Aching gets worse throughout the day Aggravating factors: Increase in pain over time Relieving factors: Lay supine  PRECAUTIONS: None  WEIGHT BEARING RESTRICTIONS: No  FALLS:  Has patient fallen in last 6 months? 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    OBJECTIVE:   DIAGNOSTIC FINDINGS:  No reports available at evaluation  PATIENT SURVEYS:  FOTO 47  COGNITIVE STATUS: Within functional limits for tasks assessed   RED FLAGS: None    SENSATION: WFL  POSTURE:  rounded shoulders, increased lumbar lordosis, and decreased thoracic kyphosis Lt PSIS elevation, in supine: Lt post innom rotation resulting in functional LLD (Rt longer)    Body Part #1 Lumbar   LE MMT LOWER EXTREMITY MMT: Not appropriate to test at evaluation due to notable pelvic rotation  MMT Right eval Left eval  Hip flexion    Hip extension    Hip abduction    Hip adduction    Hip internal rotation    Hip external rotation    Knee flexion    Knee extension    Ankle dorsiflexion    Ankle plantarflexion    Ankle inversion    Ankle eversion     (Blank rows = not tested)    ROM: Patient demonstrates range of motion within functional limits but is very guarded in forward flexion utilizing hands as a brace on her legs   TREATMENT:                                                                                                                              Treatment                            12/03/22: Pt seen for aquatic therapy today.  Treatment took place in water 3.5-4.75 ft in depth at the [facility] pool. Temp of water was 91.  Pt entered/exited the pool via  stairs using step through pattern  with   bilat hand  rail.  *L stretch *L stretch with tail wagging *Left QL stretcging *pelvic tilting sitting on noodle ant/post; hip hiking; rotation *plank on bench with hip extension/ glut isometric at end range  - mountain climber *solid noodle pull down wide stance and staggered stance x10 ea position *wall push offs *suspended supine using nek doodle and noodles as well as added ankle cuffs: hip ext x 10; knee flex/ext x10; knee flex with hip extension x5. VC and min manual assist for proper execution   Pt requires the buoyancy and hydrostatic pressure of water for support, and to offload joints by unweighting joint load by at least 50 % in navel deep water and by at least 75-80% in chest to neck deep water.  Viscosity of the water is needed for resistance of strengthening. Water current perturbations provides challenge to standing balance requiring increased core activation.    Treatment                            12/02/22:  Trigger Point Dry  Needling, Manual Therapy Treatment:  Initial or subsequent education regarding Trigger Point Dry Needling: Initial Did patient give consent to treatment with Trigger Point Dry Needling: Yes TPDN with skilled palpation and monitoring followed by STM to the following muscles: bil upper trap, levator, rhomboids  Manual prone rib mob grade 2 Prone scap retraction Practice with theracane     PATIENT EDUCATION:  Education details: Anatomy of condition, POC, HEP, exercise form/rationale Person educated: Patient Education method: Explanation, Demonstration, Tactile cues, Verbal cues, and Handouts Education comprehension: verbalized understanding, returned demonstration, verbal cues required, tactile cues required, and needs further education  HOME EXERCISE PROGRAM: [id number]   ASSESSMENT:  CLINICAL IMPRESSION: Pt demonstrates safety and indep in setting.  She attends water aerobics here at [facility] a few time a week  Focus today on posterior  core strengthening and purposefull movement. Pt edu on improved posture/avoid forward flexed position (on phone or maybe Ipad) shoulders and cervical spine neutral.  She tolerates initial session well.  Without pain.  She demonstrates good control of posterior core muscles engaging with good focus on target muscles. No reports of pain upon completion. Plan to add hip hinges.   OBJECTIVE IMPAIRMENTS: decreased activity tolerance, difficulty walking, decreased strength, increased muscle spasms, improper body mechanics, postural dysfunction, and pain.   ACTIVITY LIMITATIONS: carrying, lifting, bending, sitting, standing, stairs, bed mobility, locomotion level, and caring for others  PARTICIPATION LIMITATIONS: meal prep, cleaning, laundry, driving, shopping, community activity, occupation, and yard work  PERSONAL FACTORS: Past/current experiences and Time since onset of injury/illness/exacerbation are also affecting patient's functional outcome.   REHAB POTENTIAL: Good  CLINICAL DECISION MAKING: Evolving/moderate complexity  EVALUATION COMPLEXITY: Moderate   GOALS: Goals reviewed with patient? Yes  SHORT TERM GOALS: Target date: 5/10  Compliance with home exercise program as it has been established in the short-term Baseline: Goal status: INITIAL  2.  Independent and utilization of self-correction techniques for pelvic rotation correction Baseline:  Goal status: INITIAL    LONG TERM GOALS: Target date: POC date  Will meet Foto goal Baseline:  Goal status: INITIAL  2.  Bilateral hip abduction strength within age-appropriate range Baseline: Not appropriate to test at evaluation due to notable innominate rotation indicating poor muscular stability Goal status: INITIAL  3.  Able to ride the bike without radicular symptoms Baseline:  Goal status: INITIAL  4.  Able to utilize stretches, exercises, and self-care techniques to complete functional daily activities without limitation  by pain Baseline:  Goal status: INITIAL    PLAN:  PT FREQUENCY: 1-2x/week  PT DURATION: other: Through POC date  PLANNED INTERVENTIONS: Therapeutic exercises, Therapeutic activity, Neuromuscular re-education, Balance training, Gait training, Patient/Family education, Self Care, Joint mobilization, Stair training, Aquatic Therapy, Dry Needling, Electrical stimulation, Spinal mobilization, Cryotherapy, Moist heat, Taping, Ultrasound, Ionotophoresis 4mg /ml Dexamethasone, Manual therapy, and Re-evaluation.  PLAN FOR NEXT SESSION: Dry needling, core activation, lumbar multifidi stabilization   Corrie Dandy (Frankie) Jodiann Ognibene MPT 12/03/22 4:48 PM

## 2022-12-08 ENCOUNTER — Ambulatory Visit (HOSPITAL_BASED_OUTPATIENT_CLINIC_OR_DEPARTMENT_OTHER): Payer: BC Managed Care – PPO | Admitting: Physical Therapy

## 2022-12-08 DIAGNOSIS — M6281 Muscle weakness (generalized): Secondary | ICD-10-CM

## 2022-12-08 DIAGNOSIS — M5459 Other low back pain: Secondary | ICD-10-CM

## 2022-12-08 DIAGNOSIS — R262 Difficulty in walking, not elsewhere classified: Secondary | ICD-10-CM

## 2022-12-08 DIAGNOSIS — R293 Abnormal posture: Secondary | ICD-10-CM

## 2022-12-08 NOTE — Therapy (Signed)
OUTPATIENT PHYSICAL THERAPY TREATMENT   Patient Name: Mary Russell MRN: [id number] DOB:29-Sep-1975, 47 y.o., female Today's Date: 12/08/2022  END OF SESSION:  PT End of Session - 12/08/22 0900     Visit Number 4    Number of Visits 33    Date for PT Re-Evaluation 03/13/23    PT Start Time 0901    PT Stop Time 0940    PT Time Calculation (min) 39 min               Past Medical History:  Diagnosis Date   Abdominal migraine    Anemia    Anxiety    Chronic migraine    Depression    Lumbar disc disease    Palpitations    Thyroid disease    Hoshimotos   Past Surgical History:  Procedure Laterality Date   CESAREAN SECTION  08/2004   06/2007   LUMBAR DISC SURGERY  03/2019   L5-S1   Patient Active Problem List   Diagnosis Date Noted   Thyroid disease    Palpitations    Abdominal migraine    Chronic migraine    Lumbar disc disease    Lumbar radiculopathy 04/30/2020     REFERRING PROVIDER:  Venita Lick, MD   REFERRING DIAG: M54.51 (ICD-10-CM) - Vertebrogenic low back pain   Rationale for Evaluation and Treatment: Rehabilitation  THERAPY DIAG:  Other low back pain  Abnormal posture  Difficulty in walking, not elsewhere classified  Muscle weakness (generalized)  ONSET DATE: approx 2020   SUBJECTIVE:                                                                                                                                                                                           SUBJECTIVE STATEMENT: Pt reports her daughter had dance competition this weekend, "the weekend was rough".   "I was in pain yesterday, but at least I could walk.  No loss of ROM".    She reports she is still trying to figure out what is workingnot working well with water aerobics.  PERTINENT HISTORY:  Abdominal migraine, history of L5-S1 lumbar surgery, history of C-section  PAIN:  Are you having pain? yes: NPRS scale: 1/10 Pain location: Thoracolumbar  region Pain description: Aching gets worse throughout the day Aggravating factors: Increase in pain over time Relieving factors: Lay supine  PRECAUTIONS: None  WEIGHT BEARING RESTRICTIONS: No  FALLS:  Has patient fallen in last 6 months? 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    OBJECTIVE:   DIAGNOSTIC FINDINGS:  No reports available at evaluation  PATIENT SURVEYS:  FOTO 47  COGNITIVE STATUS: Within functional limits for tasks assessed   RED FLAGS: None    SENSATION: WFL  POSTURE:  rounded shoulders, increased lumbar lordosis, and decreased thoracic kyphosis Lt PSIS elevation, in supine: Lt post innom rotation resulting in functional LLD (Rt longer)    Body Part #1 Lumbar   LE MMT LOWER EXTREMITY MMT: Not appropriate to test at evaluation due to notable pelvic rotation  MMT Right eval Left eval  Hip flexion    Hip extension    Hip abduction    Hip adduction    Hip internal rotation    Hip external rotation    Knee flexion    Knee extension    Ankle dorsiflexion    Ankle plantarflexion    Ankle inversion    Ankle eversion     (Blank rows = not tested)    ROM: Patient demonstrates range of motion within functional limits but is very guarded in forward flexion utilizing hands as a brace on her legs   TREATMENT:                                                                                                                               Treatment                            12/08/22: Pt seen for aquatic therapy today.  Treatment took place in water 3.5-4.75 ft in depth at the [facility] pool. Temp of water was 91.  Pt entered/exited the pool independently via stairs using step through pattern with  bilat hand rail.  * warm up of walking forward/backward with reciprocal arm swing * side stepping with arm addct with rainbow hand floats;   * TrA set with short blue noodle pull down, with slow return to surface -- standing and seated  * warrior 1, holding blue short noodle - to reaching/straightening front knee and heel raise x 5 each side  * Ai Chi: gathering x 5 each side * pt shown open book exercise; verbalized understanding * seated balance on yellow noodle with single hand out of water x 2 each, then pelvic tilts *wall push up/ offs x 10 * plank position with hands on yellow hand floats (push up to/from superman position), x 8, then with alternating hip ext x 5 each  Pt requires the buoyancy and hydrostatic pressure of water for support, and to offload joints by unweighting joint load by at least 50 % in navel deep water and by at least 75-80% in chest to neck deep water.  Viscosity of the water is needed for resistance of strengthening. Water current perturbations provides challenge to standing balance requiring increased  core activation.   Treatment                            12/03/22: Pt seen for aquatic therapy today.  Treatment took place in water 3.5-4.75 ft in depth at the [facility] pool. Temp of water was 91.  Pt entered/exited the pool via  stairs using step through pattern  with  bilat hand  rail.  *L stretch *L stretch with tail wagging *Left QL stretcging *pelvic tilting sitting on noodle ant/post; hip hiking; rotation *plank on bench with hip extension/ glut isometric at end range  - mountain climber *solid noodle pull down wide stance and staggered stance x10 ea position *wall push offs *suspended supine using nek doodle and noodles as well as added ankle cuffs: hip ext x 10; knee flex/ext x10; knee flex with hip extension x5. VC and min manual assist for proper execution   Pt requires the buoyancy and hydrostatic pressure of water for support, and to  offload joints by unweighting joint load by at least 50 % in navel deep water and by at least 75-80% in chest to neck deep water.  Viscosity of the water is needed for resistance of strengthening. Water current perturbations provides challenge to standing balance requiring increased core activation.    Treatment                            12/02/22:  Trigger Point Dry Needling, Manual Therapy Treatment:  Initial or subsequent education regarding Trigger Point Dry Needling: Initial Did patient give consent to treatment with Trigger Point Dry Needling: Yes TPDN with skilled palpation and monitoring followed by STM to the following muscles: bil upper trap, levator, rhomboids  Manual prone rib mob grade 2 Prone scap retraction Practice with theracane     PATIENT EDUCATION:  Education details: Anatomy of condition, POC, HEP, exercise form/rationale Person educated: Patient Education method: Explanation, Demonstration, Tactile cues, Verbal cues, and Handouts Education comprehension: verbalized understanding, returned demonstration, verbal cues required, tactile cues required, and needs further education  HOME EXERCISE PROGRAM: [id number]   ASSESSMENT:  CLINICAL IMPRESSION: Pt tolerated exercises well in water, without increase in pain.  Pt given cues on posture throughout for neutral head and spine, along with relaxed upper traps.  Recommended lumbar pill for car - to assist with maintaining lumbar curve.   Plan to add hip hinges and cycling next visit and assess response to exercises given thus far.   OBJECTIVE IMPAIRMENTS: decreased activity tolerance, difficulty walking, decreased strength, increased muscle spasms, improper body mechanics, postural dysfunction, and pain.   ACTIVITY LIMITATIONS: carrying, lifting, bending, sitting, standing, stairs, bed mobility, locomotion level, and caring for others  PARTICIPATION LIMITATIONS: meal prep, cleaning, laundry, driving, shopping,  community activity, occupation, and yard work  PERSONAL FACTORS: Past/current experiences and Time since onset of injury/illness/exacerbation are also affecting patient's functional outcome.   REHAB POTENTIAL: Good  CLINICAL DECISION MAKING: Evolving/moderate complexity  EVALUATION COMPLEXITY: Moderate   GOALS: Goals reviewed with patient? Yes  SHORT TERM GOALS: Target date: 5/10  Compliance with home exercise program as it has been established in the short-term Baseline: Goal status: INITIAL  2.  Independent and utilization of self-correction techniques for pelvic rotation correction Baseline:  Goal status: INITIAL    LONG TERM GOALS: Target date: POC date  Will meet Foto goal Baseline:  Goal status: INITIAL  2.  Bilateral hip abduction  strength within age-appropriate range Baseline: Not appropriate to test at evaluation due to notable innominate rotation indicating poor muscular stability Goal status: INITIAL  3.  Able to ride the bike without radicular symptoms Baseline:  Goal status: INITIAL  4.  Able to utilize stretches, exercises, and self-care techniques to complete functional daily activities without limitation by pain Baseline:  Goal status: INITIAL    PLAN:  PT FREQUENCY: 1-2x/week  PT DURATION: other: Through POC date  PLANNED INTERVENTIONS: Therapeutic exercises, Therapeutic activity, Neuromuscular re-education, Balance training, Gait training, Patient/Family education, Self Care, Joint mobilization, Stair training, Aquatic Therapy, Dry Needling, Electrical stimulation, Spinal mobilization, Cryotherapy, Moist heat, Taping, Ultrasound, Ionotophoresis 4mg /ml Dexamethasone, Manual therapy, and Re-evaluation.  PLAN FOR NEXT SESSION: Dry needling, core activation, lumbar multifidi stabilization  Mayer Camel, PTA 12/08/22 1:10 PM [facility] [address] [place], [place], [address] Phone:  [phone]   Fax:  [phone]

## 2022-12-09 ENCOUNTER — Encounter (HOSPITAL_BASED_OUTPATIENT_CLINIC_OR_DEPARTMENT_OTHER): Payer: BC Managed Care – PPO

## 2022-12-10 ENCOUNTER — Encounter (HOSPITAL_BASED_OUTPATIENT_CLINIC_OR_DEPARTMENT_OTHER): Payer: Self-pay | Admitting: Physical Therapy

## 2022-12-10 ENCOUNTER — Ambulatory Visit (HOSPITAL_BASED_OUTPATIENT_CLINIC_OR_DEPARTMENT_OTHER): Payer: BC Managed Care – PPO | Attending: Orthopedic Surgery | Admitting: Physical Therapy

## 2022-12-10 DIAGNOSIS — M6281 Muscle weakness (generalized): Secondary | ICD-10-CM | POA: Diagnosis present

## 2022-12-10 DIAGNOSIS — R293 Abnormal posture: Secondary | ICD-10-CM | POA: Insufficient documentation

## 2022-12-10 DIAGNOSIS — M5459 Other low back pain: Secondary | ICD-10-CM | POA: Insufficient documentation

## 2022-12-10 DIAGNOSIS — R262 Difficulty in walking, not elsewhere classified: Secondary | ICD-10-CM | POA: Diagnosis present

## 2022-12-10 NOTE — Therapy (Signed)
OUTPATIENT PHYSICAL THERAPY TREATMENT   Patient Name: Mary Russell MRN: [id number] DOB:January 14, 1976, 47 y.o., female Today's Date: 12/10/2022  END OF SESSION:  PT End of Session - 12/10/22 1206     Visit Number 5    Number of Visits 33    Date for PT Re-Evaluation 03/13/23    PT Start Time 1200    PT Stop Time 1240    PT Time Calculation (min) 40 min    Activity Tolerance Patient tolerated treatment well    Behavior During Therapy [facility] for tasks assessed/performed               Past Medical History:  Diagnosis Date   Abdominal migraine    Anemia    Anxiety    Chronic migraine    Depression    Lumbar disc disease    Palpitations    Thyroid disease    Hoshimotos   Past Surgical History:  Procedure Laterality Date   CESAREAN SECTION  08/2004   06/2007   LUMBAR DISC SURGERY  03/2019   L5-S1   Patient Active Problem List   Diagnosis Date Noted   Thyroid disease    Palpitations    Abdominal migraine    Chronic migraine    Lumbar disc disease    Lumbar radiculopathy 04/30/2020     REFERRING PROVIDER:  Venita Lick, MD   REFERRING DIAG: M54.51 (ICD-10-CM) - Vertebrogenic low back pain   Rationale for Evaluation and Treatment: Rehabilitation  THERAPY DIAG:  Other low back pain  Abnormal posture  Difficulty in walking, not elsewhere classified  ONSET DATE: approx 2020   SUBJECTIVE:                                                                                                                                                                                           SUBJECTIVE STATEMENT: Pt reports she was able to complete water aerobics class after last therapy session, but in very modified way.  She has noticed a "pinching" sensation in her Lt groin today.   PERTINENT HISTORY:  Abdominal migraine, history of L5-S1 lumbar surgery, history of C-section  PAIN:  Are you having pain? yes: NPRS scale: 1/10 Pain location: Thoracolumbar region Pain  description: Aching gets worse throughout the day Aggravating factors: Increase in pain over time Relieving factors: Lay supine  PRECAUTIONS: None  WEIGHT BEARING RESTRICTIONS: No  FALLS:  Has patient fallen in last 6 months? 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    OBJECTIVE:   DIAGNOSTIC FINDINGS:  No reports available at evaluation  PATIENT SURVEYS:  FOTO 47  COGNITIVE STATUS: Within functional limits for tasks assessed   RED FLAGS: None    SENSATION: WFL  POSTURE:  rounded shoulders, increased lumbar lordosis, and decreased thoracic kyphosis Lt PSIS elevation, in supine: Lt post innom rotation resulting in functional LLD (Rt longer)    Body Part #1 Lumbar   LE MMT LOWER EXTREMITY MMT: Not appropriate to test at evaluation due to notable pelvic rotation  MMT Right eval Left eval  Hip flexion    Hip extension    Hip abduction    Hip adduction    Hip internal rotation    Hip external rotation    Knee flexion    Knee extension    Ankle dorsiflexion    Ankle plantarflexion    Ankle inversion    Ankle eversion     (Blank rows = not tested)    ROM: Patient demonstrates range of motion within functional limits but is very guarded in forward flexion utilizing hands as a brace on her legs   TREATMENT:                                                                                                                             Treatment                            12/10/22: Pt seen for aquatic therapy today.  Treatment took place in water 3.5-4.75 ft in depth at the [facility] pool. Temp of water was 91.  Pt entered/exited the pool independently via stairs using step through pattern with bilat hand rail.  *  warm up of walking forward/backward  * side stepping without/with arm addct with rainbow hand floats;   * TrA set with bilat and single rainbow hand float at side under water, walking forward/backward - 3 laps total * TrA set with short blue noodle pull down, with slow return to surface -- standing x 10; repeated with solid noodle x 5 * back against wall:  3 way LE stretch with ankle supported by solid noodle  * seated balance on yellow noodle with single hand out of water x 2 each, ( L hand lift a challenge) * plank position with hands on yellow noodle(push up to/from superman position), x 5;  then with alternating hip ext x 5 each  * opp arm / leg lift with hand x 5 each *wall push up/ offs x 10, cues for alignment * squats pushing rainbow hand float under water with cues for vertical trunk, ab set x 5  Treatment                            12/08/22: Pt  seen for aquatic therapy today.  Treatment took place in water 3.5-4.75 ft in depth at the [facility] pool. Temp of water was 91.  Pt entered/exited the pool independently via stairs using step through pattern with bilat hand rail.  * warm up of walking forward/backward with reciprocal arm swing * side stepping with arm addct with rainbow hand floats;   * TrA set with short blue noodle pull down, with slow return to surface -- standing and seated  * warrior 1, holding blue short noodle - to reaching/straightening front knee and heel raise x 5 each side  * Ai Chi: gathering x 5 each side * pt shown open book exercise; verbalized understanding * seated balance on yellow noodle with single hand out of water x 2 each, then pelvic tilts *wall push up/ offs x 10 * plank position with hands on yellow hand floats (push up to/from superman position), x 8, then with alternating hip ext x 5 each   Treatment                            12/03/22: Pt seen for aquatic therapy today.  Treatment took place in water 3.5-4.75 ft in depth at the  [facility] pool. Temp of water was 91.  Pt entered/exited the pool via  stairs using step through pattern  with  bilat hand  rail.  *L stretch *L stretch with tail wagging *Left QL stretcging *pelvic tilting sitting on noodle ant/post; hip hiking; rotation *plank on bench with hip extension/ glut isometric at end range  - mountain climber *solid noodle pull down wide stance and staggered stance x10 ea position *wall push offs *suspended supine using nek doodle and noodles as well as added ankle cuffs: hip ext x 10; knee flex/ext x10; knee flex with hip extension x5. VC and min manual assist for proper execution  Treatment                            12/02/22:  Trigger Point Dry Needling, Manual Therapy Treatment:  Initial or subsequent education regarding Trigger Point Dry Needling: Initial Did patient give consent to treatment with Trigger Point Dry Needling: Yes TPDN with skilled palpation and monitoring followed by STM to the following muscles: bil upper trap, levator, rhomboids  Manual prone rib mob grade 2 Prone scap retraction Practice with theracane     PATIENT EDUCATION:  Education details: Anatomy of condition, POC, HEP, exercise form/rationale Person educated: Patient Education method: Explanation, Demonstration, Tactile cues, Verbal cues, and Handouts Education comprehension: verbalized understanding, returned demonstration, verbal cues required, tactile cues required, and needs further education  HOME EXERCISE PROGRAM: [email or web address]   ASSESSMENT:  CLINICAL IMPRESSION: Pt tolerated exercises well in water, without increase in pain. No "pinching" sensation in groin, except with 3 way stretch when LE crossed midline.  Pt given cues on posture throughout for neutral head and spine, along with core engagement.  Will continue to be mindful to avoid trunk rotation as this has flared up back/ radicular symptoms in past.  Goals are ongoing.  Plan to trial hip hinges and  cycling next visit and assess response to exercises given thus far.   OBJECTIVE IMPAIRMENTS: decreased activity tolerance, difficulty walking, decreased strength, increased muscle spasms, improper body mechanics, postural dysfunction, and pain.   ACTIVITY LIMITATIONS: carrying, lifting, bending, sitting, standing, stairs, bed mobility, locomotion level, and caring for others  PARTICIPATION  LIMITATIONS: meal prep, cleaning, laundry, driving, shopping, community activity, occupation, and yard work  PERSONAL FACTORS: Past/current experiences and Time since onset of injury/illness/exacerbation are also affecting patient's functional outcome.   REHAB POTENTIAL: Good  CLINICAL DECISION MAKING: Evolving/moderate complexity  EVALUATION COMPLEXITY: Moderate   GOALS: Goals reviewed with patient? Yes  SHORT TERM GOALS: Target date: 5/10  Compliance with home exercise program as it has been established in the short-term Baseline: Goal status: INITIAL  2.  Independent and utilization of self-correction techniques for pelvic rotation correction Baseline:  Goal status: INITIAL    LONG TERM GOALS: Target date: POC date  Will meet Foto goal Baseline:  Goal status: INITIAL  2.  Bilateral hip abduction strength within age-appropriate range Baseline: Not appropriate to test at evaluation due to notable innominate rotation indicating poor muscular stability Goal status: INITIAL  3.  Able to ride the bike without radicular symptoms Baseline:  Goal status: INITIAL  4.  Able to utilize stretches, exercises, and self-care techniques to complete functional daily activities without limitation by pain Baseline:  Goal status: INITIAL    PLAN:  PT FREQUENCY: 1-2x/week  PT DURATION: other: Through POC date  PLANNED INTERVENTIONS: Therapeutic exercises, Therapeutic activity, Neuromuscular re-education, Balance training, Gait training, Patient/Family education, Self Care, Joint  mobilization, Stair training, Aquatic Therapy, Dry Needling, Electrical stimulation, Spinal mobilization, Cryotherapy, Moist heat, Taping, Ultrasound, Ionotophoresis 4mg /ml Dexamethasone, Manual therapy, and Re-evaluation.  PLAN FOR NEXT SESSION: Dry needling, core activation, lumbar multifidi stabilization  Mayer Camel, PTA 12/10/22 12:58 PM [facility] [address] [place], [place], [address] Phone: [phone]   Fax:  [phone]

## 2022-12-15 ENCOUNTER — Encounter (HOSPITAL_BASED_OUTPATIENT_CLINIC_OR_DEPARTMENT_OTHER): Payer: BC Managed Care – PPO

## 2022-12-16 NOTE — Therapy (Signed)
OUTPATIENT PHYSICAL THERAPY TREATMENT   Patient Name: Mary Russell MRN: [id number] DOB:Mar 31, 1976, 47 y.o., female Today's Date: 12/17/2022  END OF SESSION:  PT End of Session - 12/17/22 0904     Visit Number 6    Number of Visits 33    Date for PT Re-Evaluation 03/13/23    PT Start Time 0905    PT Stop Time 0945    PT Time Calculation (min) 40 min    Activity Tolerance Patient tolerated treatment well    Behavior During Therapy [facility] for tasks assessed/performed               Past Medical History:  Diagnosis Date   Abdominal migraine    Anemia    Anxiety    Chronic migraine    Depression    Lumbar disc disease    Palpitations    Thyroid disease    Hoshimotos   Past Surgical History:  Procedure Laterality Date   CESAREAN SECTION  08/2004   06/2007   LUMBAR DISC SURGERY  03/2019   L5-S1   Patient Active Problem List   Diagnosis Date Noted   Thyroid disease    Palpitations    Abdominal migraine    Chronic migraine    Lumbar disc disease    Lumbar radiculopathy 04/30/2020     REFERRING PROVIDER:  Venita Lick, MD   REFERRING DIAG: M54.51 (ICD-10-CM) - Vertebrogenic low back pain   Rationale for Evaluation and Treatment: Rehabilitation  THERAPY DIAG:  Other low back pain  Abnormal posture  Difficulty in walking, not elsewhere classified  Muscle weakness (generalized)  ONSET DATE: approx 2020   SUBJECTIVE:                                                                                                                                                                                           SUBJECTIVE STATEMENT: Pt busy week exercise on some level everyday.  Increased pain 5-6/10 with radation to knee.  Was able to rest and imprvoed today.  PERTINENT HISTORY:  Abdominal migraine, history of L5-S1 lumbar surgery, history of C-section  PAIN:  Are you having pain? yes: NPRS scale: 1/10 Pain location: Thoracolumbar region Pain description:  Aching gets worse throughout the day Aggravating factors: Increase in pain over time Relieving factors: Lay supine  PRECAUTIONS: None  WEIGHT BEARING RESTRICTIONS: No  FALLS:  Has patient fallen in last 6 months? 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    OBJECTIVE:   DIAGNOSTIC FINDINGS:  No reports available at evaluation  PATIENT SURVEYS:  FOTO 47  COGNITIVE STATUS: Within functional limits for tasks assessed   RED FLAGS: None    SENSATION: WFL  POSTURE:  rounded shoulders, increased lumbar lordosis, and decreased thoracic kyphosis Lt PSIS elevation, in supine: Lt post innom rotation resulting in functional LLD (Rt longer)    Body Part #1 Lumbar   LE MMT LOWER EXTREMITY MMT: Not appropriate to test at evaluation due to notable pelvic rotation  MMT Right eval Left eval  Hip flexion    Hip extension    Hip abduction    Hip adduction    Hip internal rotation    Hip external rotation    Knee flexion    Knee extension    Ankle dorsiflexion    Ankle plantarflexion    Ankle inversion    Ankle eversion     (Blank rows = not tested)    ROM: Patient demonstrates range of motion within functional limits but is very guarded in forward flexion utilizing hands as a brace on her legs  TREATMENT:                                                                                                                             Treatment                            12/17/22:  Pt seen for aquatic therapy today.  Treatment took place in water 3.5-4.75 ft in depth at the [facility] pool. Temp of water was 91.  Pt entered/exited the pool independently via stairs using step through pattern with bilat hand rail.  * warm up of  walking forward/backward  * side stepping without/with arm addct with rainbow hand floats;   *Cycling on noodle added aerobic capacity element with intervals  - add/abd; jumping jacks *TrA set using solid noodle wide stance then staggered *L stretch; tail wagging *Plank on yellow noodle *3 way stretch *sitting balance on noodle    Treatment                            12/10/22: Pt seen for aquatic therapy today.  Treatment took place in water 3.5-4.75 ft in depth at the [facility] pool. Temp of water was 91.  Pt entered/exited the pool independently via stairs using step through pattern with bilat hand rail.  * warm up of walking forward/backward  * side stepping without/with arm addct with rainbow hand floats;   * TrA set with bilat and single rainbow hand float at side under water, walking forward/backward - 3 laps total * TrA set with short blue noodle pull down, with slow return to surface -- standing x 10; repeated with solid noodle x 5 *  back against wall:  3 way LE stretch with ankle supported by solid noodle  * seated balance on yellow noodle with single hand out of water x 2 each, ( L hand lift a challenge) * plank position with hands on yellow noodle(push up to/from superman position), x 5;  then with alternating hip ext x 5 each  * opp arm / leg lift with hand x 5 each *wall push up/ offs x 10, cues for alignment * squats pushing rainbow hand float under water with cues for vertical trunk, ab set x 5  Treatment                            12/08/22: Pt seen for aquatic therapy today.  Treatment took place in water 3.5-4.75 ft in depth at the [facility] pool. Temp of water was 91.  Pt entered/exited the pool independently via stairs using step through pattern with bilat hand rail.  * warm up of walking forward/backward with reciprocal arm swing * side stepping with arm addct with rainbow hand floats;   * TrA set with short blue noodle pull down, with slow  return to surface -- standing and seated  * warrior 1, holding blue short noodle - to reaching/straightening front knee and heel raise x 5 each side  * Ai Chi: gathering x 5 each side * pt shown open book exercise; verbalized understanding * seated balance on yellow noodle with single hand out of water x 2 each, then pelvic tilts *wall push up/ offs x 10 * plank position with hands on yellow hand floats (push up to/from superman position), x 8, then with alternating hip ext x 5 each   Treatment                            12/03/22: Pt seen for aquatic therapy today.  Treatment took place in water 3.5-4.75 ft in depth at the [facility] pool. Temp of water was 91.  Pt entered/exited the pool via  stairs using step through pattern  with  bilat hand  rail.  *L stretch *L stretch with tail wagging *Left QL stretcging *pelvic tilting sitting on noodle ant/post; hip hiking; rotation *plank on bench with hip extension/ glut isometric at end range  - mountain climber *solid noodle pull down wide stance and staggered stance x10 ea position *wall push offs *suspended supine using nek doodle and noodles as well as added ankle cuffs: hip ext x 10; knee flex/ext x10; knee flex with hip extension x5. VC and min manual assist for proper execution  Treatment                            12/02/22:  Trigger Point Dry Needling, Manual Therapy Treatment:  Initial or subsequent education regarding Trigger Point Dry Needling: Initial Did patient give consent to treatment with Trigger Point Dry Needling: Yes TPDN with skilled palpation and monitoring followed by STM to the following muscles: bil upper trap, levator, rhomboids  Manual prone rib mob grade 2 Prone scap retraction Practice with theracane     PATIENT EDUCATION:  Education details: Anatomy of condition, POC, HEP, exercise form/rationale Person educated: Patient Education method: Explanation, Demonstration, Tactile cues, Verbal cues, and  Handouts Education comprehension: verbalized understanding, returned demonstration, verbal cues required, tactile cues required, and needs further education  HOME EXERCISE PROGRAM: [email or web address] Added  aquatics Access Code: [email or web address] URL: [email or web address] Date: 11/19/2022 Prepared by: Geni Bers  Exercises  - Standing 'L' Stretch at [facility]  - 7 x weekly - 1 x daily - 3 sets - 10 reps - Hand Buoy Carry  - 7 x weekly - 1 x daily - 3 sets - 10 reps - Side to Side Hamstring Stretch with Noodle at Pool Wall  - 7 x weekly - 1 x daily - 3 sets - 10 reps - Noodle press  - 7 x weekly - 1 x daily - 3 sets - 10 reps - Seated Straddle on Flotation Forward Breast Stroke Arms and Bicycle Legs  - 7 x weekly - 1 x daily - 3 sets - 10 reps - Sitting Balance on Pool Noodle  - 7 x weekly - 1 x daily - 3 sets - 10 reps   ASSESSMENT:  CLINICAL IMPRESSION: Pt instructed through exercises with intent to demonstrate added strengthening with cardio added using noodle to decrease loading. She tolerates well. Completes session with good toleration, no pain nor fatigue.  She reports flare of pain sx over weekend after realizing she has too much activity last week. She is becoming more versed in self management of her chronic condition. Reports little compliance with land based HEP. Begun creating and instruction on aquatic HEP. She is instructed that aquatic-PT is intended to facilitate independence with self-directed HEP. She will be ready to be indep within the next few sessions.  She has already begun participating in the water aerobic classes here as a [employer] member.  . We will add increased land based intervention as needed going forward. Goals ongoing     OBJECTIVE IMPAIRMENTS: decreased activity tolerance, difficulty walking, decreased strength, increased muscle spasms, improper body mechanics, postural dysfunction, and pain.   ACTIVITY LIMITATIONS: carrying, lifting, bending, sitting,  standing, stairs, bed mobility, locomotion level, and caring for others  PARTICIPATION LIMITATIONS: meal prep, cleaning, laundry, driving, shopping, community activity, occupation, and yard work  PERSONAL FACTORS: Past/current experiences and Time since onset of injury/illness/exacerbation are also affecting patient's functional outcome.   REHAB POTENTIAL: Good  CLINICAL DECISION MAKING: Evolving/moderate complexity  EVALUATION COMPLEXITY: Moderate   GOALS: Goals reviewed with patient? Yes  SHORT TERM GOALS: Target date: 5/10  Compliance with home exercise program as it has been established in the short-term Baseline: Goal status: MET  2.  Independent and utilization of self-correction techniques for pelvic rotation correction Baseline:  Goal status: INITIAL    LONG TERM GOALS: Target date: POC date  Will meet Foto goal Baseline:  Goal status: INITIAL  2.  Bilateral hip abduction strength within age-appropriate range Baseline: Not appropriate to test at evaluation due to notable innominate rotation indicating poor muscular stability Goal status: INITIAL  3.  Able to ride the bike without radicular symptoms Baseline:  Goal status: INITIAL  4.  Able to utilize stretches, exercises, and self-care techniques to complete functional daily activities without limitation by pain Baseline:  Goal status: INITIAL    PLAN:  PT FREQUENCY: 1-2x/week  PT DURATION: other: Through POC date  PLANNED INTERVENTIONS: Therapeutic exercises, Therapeutic activity, Neuromuscular re-education, Balance training, Gait training, Patient/Family education, Self Care, Joint mobilization, Stair training, Aquatic Therapy, Dry Needling, Electrical stimulation, Spinal mobilization, Cryotherapy, Moist heat, Taping, Ultrasound, Ionotophoresis 4mg /ml Dexamethasone, Manual therapy, and Re-evaluation.  PLAN FOR NEXT SESSION: Dry needling, core activation, lumbar multifidi stabilization  Corrie Dandy  Tomma Lightning) Ronnette Rump MPT  12/17/22 11:33 AM [facility] [phone]  [place], [place], [address] Phone: [phone]   Fax:  [phone]

## 2022-12-17 ENCOUNTER — Encounter (HOSPITAL_BASED_OUTPATIENT_CLINIC_OR_DEPARTMENT_OTHER): Payer: Self-pay | Admitting: Physical Therapy

## 2022-12-17 ENCOUNTER — Ambulatory Visit (HOSPITAL_BASED_OUTPATIENT_CLINIC_OR_DEPARTMENT_OTHER): Payer: BC Managed Care – PPO | Admitting: Physical Therapy

## 2022-12-17 DIAGNOSIS — M6281 Muscle weakness (generalized): Secondary | ICD-10-CM

## 2022-12-17 DIAGNOSIS — R293 Abnormal posture: Secondary | ICD-10-CM

## 2022-12-17 DIAGNOSIS — R262 Difficulty in walking, not elsewhere classified: Secondary | ICD-10-CM

## 2022-12-17 DIAGNOSIS — M5459 Other low back pain: Secondary | ICD-10-CM

## 2022-12-23 ENCOUNTER — Encounter (HOSPITAL_BASED_OUTPATIENT_CLINIC_OR_DEPARTMENT_OTHER): Payer: BC Managed Care – PPO | Admitting: Physical Therapy

## 2022-12-25 ENCOUNTER — Encounter (HOSPITAL_BASED_OUTPATIENT_CLINIC_OR_DEPARTMENT_OTHER): Payer: Self-pay | Admitting: Physical Therapy

## 2022-12-25 ENCOUNTER — Ambulatory Visit (HOSPITAL_BASED_OUTPATIENT_CLINIC_OR_DEPARTMENT_OTHER): Payer: BC Managed Care – PPO | Admitting: Physical Therapy

## 2022-12-25 DIAGNOSIS — M5459 Other low back pain: Secondary | ICD-10-CM

## 2022-12-25 DIAGNOSIS — R262 Difficulty in walking, not elsewhere classified: Secondary | ICD-10-CM

## 2022-12-25 DIAGNOSIS — M6281 Muscle weakness (generalized): Secondary | ICD-10-CM

## 2022-12-25 DIAGNOSIS — R293 Abnormal posture: Secondary | ICD-10-CM

## 2022-12-25 NOTE — Therapy (Signed)
OUTPATIENT PHYSICAL THERAPY TREATMENT   Patient Name: Mary Russell MRN: [id number] DOB:[birth year], 47 y.o., female Today's Date: 12/25/2022  END OF SESSION:  PT End of Session - 12/25/22 0955     Visit Number 7    Number of Visits 33    Date for PT Re-Evaluation 03/13/23    PT Start Time 0947    PT Stop Time 1020    PT Time Calculation (min) 33 min    Activity Tolerance Patient tolerated treatment well    Behavior During Therapy [facility] for tasks assessed/performed               Past Medical History:  Diagnosis Date   Abdominal migraine    Anemia    Anxiety    Chronic migraine    Depression    Lumbar disc disease    Palpitations    Thyroid disease    Hoshimotos   Past Surgical History:  Procedure Laterality Date   CESAREAN SECTION  08/2004   06/2007   LUMBAR DISC SURGERY  03/2019   L5-S1   Patient Active Problem List   Diagnosis Date Noted   Thyroid disease    Palpitations    Abdominal migraine    Chronic migraine    Lumbar disc disease    Lumbar radiculopathy 04/30/2020     REFERRING PROVIDER:  Venita Lick, MD   REFERRING DIAG: M54.51 (ICD-10-CM) - Vertebrogenic low back pain   Rationale for Evaluation and Treatment: Rehabilitation  THERAPY DIAG:  Other low back pain  Abnormal posture  Difficulty in walking, not elsewhere classified  Muscle weakness (generalized)  ONSET DATE: approx 2020   SUBJECTIVE:                                                                                                                                                                                           SUBJECTIVE STATEMENT: Pt reports completing water aerobic walking class yesterday well.  States she had a little tightness in right hip/lb. Pt instructed to make sure she stretches well before and after participation in class.  She VU  PERTINENT HISTORY:  Abdominal migraine, history of L5-S1 lumbar surgery, history of C-section  PAIN:  Are you  having pain? yes: NPRS scale: 1/10 Pain location: Thoracolumbar region Pain description: Aching gets worse throughout the day Aggravating factors: Increase in pain over time Relieving factors: Lay supine  PRECAUTIONS: None  WEIGHT BEARING RESTRICTIONS: No  FALLS:  Has patient fallen in last 6 months? 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    OBJECTIVE:   DIAGNOSTIC FINDINGS:  No reports available at evaluation  PATIENT SURVEYS:  FOTO 47  COGNITIVE STATUS: Within functional limits for tasks assessed   RED FLAGS: None    SENSATION: WFL  POSTURE:  rounded shoulders, increased lumbar lordosis, and decreased thoracic kyphosis Lt PSIS elevation, in supine: Lt post innom rotation resulting in functional LLD (Rt longer)    Body Part #1 Lumbar   LE MMT LOWER EXTREMITY MMT: Not appropriate to test at evaluation due to notable pelvic rotation  MMT Right eval Left eval  Hip flexion    Hip extension    Hip abduction    Hip adduction    Hip internal rotation    Hip external rotation    Knee flexion    Knee extension    Ankle dorsiflexion    Ankle plantarflexion    Ankle inversion    Ankle eversion     (Blank rows = not tested)    ROM: Patient demonstrates range of motion within functional limits but is very guarded in forward flexion utilizing hands as a brace on her legs  TREATMENT:                                                                                                                             12/25/22 Pt seen for aquatic therapy today.  Treatment took place in water 3.5-4.75 ft in depth at the [facility] pool. Temp of water was 91.  Pt entered/exited the pool independently via stairs using step  through pattern with bilat hand rail.  * warm up of walking forward/backward  *L stretch; added tail wagging *Lumbopelvic mobilization/stretching seated on noodle: ant/post pelvic tilts; hip hiking; rotation * seated balance on yellow noodle with bilat hand out of water (easy) * plank position with hands on yellow noodle(push up to/from superman position), x 5;  then with alternating hip ext x 5 each  *Balance standing on noodle: rolling forward then back (heels to toes) gaining balance x 10; standing balance directly on noodle wide stance then repeated with feet together *wall push up/ offs x 10, cues for alignment *Side to side pendulum: instruction then x 7  - QL stretching R/L above position    Treatment                            12/17/22:  Pt seen for aquatic therapy today.  Treatment took place in water 3.5-4.75 ft in depth at the [facility] pool. Temp of water was 91.  Pt entered/exited the pool independently via stairs using step through pattern with bilat hand rail.  * warm up of walking forward/backward  * side stepping without/with arm addct with rainbow hand floats;   *Cycling on noodle added aerobic capacity element  with intervals  - add/abd; jumping jacks *TrA set using solid noodle wide stance then staggered *L stretch; tail wagging *Plank on yellow noodle *3 way stretch *sitting balance on noodle    Treatment                            12/10/22: Pt seen for aquatic therapy today.  Treatment took place in water 3.5-4.75 ft in depth at the [facility] pool. Temp of water was 91.  Pt entered/exited the pool independently via stairs using step through pattern with bilat hand rail.  * warm up of walking forward/backward  * side stepping without/with arm addct with rainbow hand floats;   * TrA set with bilat and single rainbow hand float at side under water, walking forward/backward - 3 laps total * TrA set with short blue noodle pull down, with slow  return to surface -- standing x 10; repeated with solid noodle x 5 * back against wall:  3 way LE stretch with ankle supported by solid noodle  * seated balance on yellow noodle with single hand out of water x 2 each, ( L hand lift a challenge) * plank position with hands on yellow noodle(push up to/from superman position), x 5;  then with alternating hip ext x 5 each  * opp arm / leg lift with hand x 5 each *wall push up/ offs x 10, cues for alignment * squats pushing rainbow hand float under water with cues for vertical trunk, ab set x 5    Treatment                            12/08/22: Pt seen for aquatic therapy today.  Treatment took place in water 3.5-4.75 ft in depth at the [facility] pool. Temp of water was 91.  Pt entered/exited the pool independently via stairs using step through pattern with bilat hand rail.  * warm up of walking forward/backward with reciprocal arm swing * side stepping with arm addct with rainbow hand floats;   * TrA set with short blue noodle pull down, with slow return to surface -- standing and seated  * warrior 1, holding blue short noodle - to reaching/straightening front knee and heel raise x 5 each side  * Ai Chi: gathering x 5 each side * pt shown open book exercise; verbalized understanding * seated balance on yellow noodle with single hand out of water x 2 each, then pelvic tilts *wall push up/ offs x 10 * plank position with hands on yellow hand floats (push up to/from superman position), x 8, then with alternating hip ext x 5 each   Treatment                            12/03/22: Pt seen for aquatic therapy today.  Treatment took place in water 3.5-4.75 ft in depth at the [facility] pool. Temp of water was 91.  Pt entered/exited the pool via  stairs using step through pattern  with  bilat hand  rail.  *L stretch *L stretch with tail wagging *Left QL stretcging *pelvic tilting sitting on noodle ant/post; hip hiking;  rotation *plank on bench with hip extension/ glut isometric at end range  - mountain climber *solid noodle pull down wide stance and staggered stance x10 ea position *wall push offs *suspended supine using nek doodle  and noodles as well as added ankle cuffs: hip ext x 10; knee flex/ext x10; knee flex with hip extension x5. VC and min manual assist for proper execution  Treatment                            12/02/22:  Trigger Point Dry Needling, Manual Therapy Treatment:  Initial or subsequent education regarding Trigger Point Dry Needling: Initial Did patient give consent to treatment with Trigger Point Dry Needling: Yes TPDN with skilled palpation and monitoring followed by STM to the following muscles: bil upper trap, levator, rhomboids  Manual prone rib mob grade 2 Prone scap retraction Practice with theracane     PATIENT EDUCATION:  Education details: Anatomy of condition, POC, HEP, exercise form/rationale Person educated: Patient Education method: Explanation, Demonstration, Tactile cues, Verbal cues, and Handouts Education comprehension: verbalized understanding, returned demonstration, verbal cues required, tactile cues required, and needs further education  HOME EXERCISE PROGRAM: Access Code: [email or web address] URL: [email or web address] Date: 12/25/2022 Prepared by: Geni Bers  Exercises - Side to Side Pendulum Swing with Foam Dumbbells and Ankle Floats  - 1 x daily - 7 x weekly - 3 sets - 10 reps - Forward Backward Pendulum Swings with Hip Abduction and Adduction  - 1 x daily - 7 x weekly - 3 sets - 10 reps - Plank on Long Hand Float  - 1 x daily - 7 x weekly - 3 sets - 10 reps - Plank on Long [employer] with Arm Lifts  - 1 x daily - 7 x weekly - 3 sets - 10 reps - Sitting Balance on Pool Noodle  - 1 x daily - 7 x weekly - 3 sets - 10 reps - Sit to Stand  - 1 x daily - 7 x weekly - 3 sets - 10 reps - Kick Board Row  - 1 x daily - 7 x weekly - 3 sets - 10 reps -  Noodle press  - 1 x daily - 7 x weekly - 3 sets - 10 reps - Standing 'L' Stretch at [employer]  - 1 x daily - 7 x weekly - 3 sets - 10 reps - Squat  - 1 x daily - 7 x weekly - 3 sets - 10 reps - Standing Balance on Noodle at [employer]  - 1 x daily - 7 x weekly - 3 sets - 10 reps - Squat on Noodle  - 1 x daily - 7 x weekly - 3 sets - 10 reps   ASSESSMENT:  CLINICAL IMPRESSION: Pt needed to leave early from session.  She is directed through upper level core exercises adding pendulums which she gains motor plan quickly and executes well.  Monitored back discomfort throughout.  She does report some tightness but is relieved with stretching.  Added to aquatic HEP.  Plan to continue with upper level challenges as HEP is created.  Goals ongoing.   Pt instructed through exercises with intent to demonstrate added strengthening with cardio added using noodle to decrease loading. She tolerates well. Completes session with good toleration, no pain nor fatigue.  She reports flare of pain sx over weekend after realizing she has too much activity last week. She is becoming more versed in self management of her chronic condition. Reports little compliance with land based HEP. Begun creating and instruction on aquatic HEP. She is instructed that aquatic-PT is intended to facilitate independence with self-directed HEP. She will be  ready to be indep within the next few sessions.  She has already begun participating in the water aerobic classes here as a [facility] member.  . We will add increased land based intervention as needed going forward. Goals ongoing     OBJECTIVE IMPAIRMENTS: decreased activity tolerance, difficulty walking, decreased strength, increased muscle spasms, improper body mechanics, postural dysfunction, and pain.   ACTIVITY LIMITATIONS: carrying, lifting, bending, sitting, standing, stairs, bed mobility, locomotion level, and caring for others  PARTICIPATION LIMITATIONS: meal prep, cleaning,  laundry, driving, shopping, community activity, occupation, and yard work  PERSONAL FACTORS: Past/current experiences and Time since onset of injury/illness/exacerbation are also affecting patient's functional outcome.   REHAB POTENTIAL: Good  CLINICAL DECISION MAKING: Evolving/moderate complexity  EVALUATION COMPLEXITY: Moderate   GOALS: Goals reviewed with patient? Yes  SHORT TERM GOALS: Target date: 5/10  Compliance with home exercise program as it has been established in the short-term Baseline: Goal status: MET  2.  Independent and utilization of self-correction techniques for pelvic rotation correction Baseline:  Goal status: INITIAL    LONG TERM GOALS: Target date: POC date  Will meet Foto goal Baseline:  Goal status: INITIAL  2.  Bilateral hip abduction strength within age-appropriate range Baseline: Not appropriate to test at evaluation due to notable innominate rotation indicating poor muscular stability Goal status: INITIAL  3.  Able to ride the bike without radicular symptoms Baseline:  Goal status: INITIAL  4.  Able to utilize stretches, exercises, and self-care techniques to complete functional daily activities without limitation by pain Baseline:  Goal status: INITIAL    PLAN:  PT FREQUENCY: 1-2x/week  PT DURATION: other: Through POC date  PLANNED INTERVENTIONS: Therapeutic exercises, Therapeutic activity, Neuromuscular re-education, Balance training, Gait training, Patient/Family education, Self Care, Joint mobilization, Stair training, Aquatic Therapy, Dry Needling, Electrical stimulation, Spinal mobilization, Cryotherapy, Moist heat, Taping, Ultrasound, Ionotophoresis 4mg /ml Dexamethasone, Manual therapy, and Re-evaluation.  PLAN FOR NEXT SESSION: Dry needling, core activation, lumbar multifidi stabilization  Rushie Chestnut) Tenasia Aull MPT  12/25/22 9:58 AM [facility] [address] [place], [place], [address] Phone: [phone]   Fax:  [phone]

## 2022-12-29 ENCOUNTER — Encounter (HOSPITAL_BASED_OUTPATIENT_CLINIC_OR_DEPARTMENT_OTHER): Payer: Self-pay

## 2022-12-29 ENCOUNTER — Ambulatory Visit (HOSPITAL_BASED_OUTPATIENT_CLINIC_OR_DEPARTMENT_OTHER): Payer: BC Managed Care – PPO

## 2022-12-29 DIAGNOSIS — M5459 Other low back pain: Secondary | ICD-10-CM | POA: Diagnosis not present

## 2022-12-29 DIAGNOSIS — M6281 Muscle weakness (generalized): Secondary | ICD-10-CM

## 2022-12-29 DIAGNOSIS — R293 Abnormal posture: Secondary | ICD-10-CM

## 2022-12-29 DIAGNOSIS — R262 Difficulty in walking, not elsewhere classified: Secondary | ICD-10-CM

## 2022-12-29 NOTE — Therapy (Signed)
OUTPATIENT PHYSICAL THERAPY TREATMENT   Patient Name: Mary Russell MRN: [id number] DOB:September 05, 1975, 47 y.o., female Today's Date: 12/29/2022  END OF SESSION:  PT End of Session - 12/29/22 0926     Visit Number 8    Number of Visits 33    Date for PT Re-Evaluation 03/13/23    PT Start Time 0931    PT Stop Time 1011    PT Time Calculation (min) 40 min    Activity Tolerance Patient tolerated treatment well    Behavior During Therapy [facility] for tasks assessed/performed               Past Medical History:  Diagnosis Date   Abdominal migraine    Anemia    Anxiety    Chronic migraine    Depression    Lumbar disc disease    Palpitations    Thyroid disease    Hoshimotos   Past Surgical History:  Procedure Laterality Date   CESAREAN SECTION  08/2004   06/2007   LUMBAR DISC SURGERY  03/2019   L5-S1   Patient Active Problem List   Diagnosis Date Noted   Thyroid disease    Palpitations    Abdominal migraine    Chronic migraine    Lumbar disc disease    Lumbar radiculopathy 04/30/2020     REFERRING PROVIDER:  Venita Lick, MD   REFERRING DIAG: M54.51 (ICD-10-CM) - Vertebrogenic low back pain   Rationale for Evaluation and Treatment: Rehabilitation  THERAPY DIAG:  Other low back pain  Difficulty in walking, not elsewhere classified  Muscle weakness (generalized)  Abnormal posture  ONSET DATE: approx 2020   SUBJECTIVE:                                                                                                                                                                                           SUBJECTIVE STATEMENT: Pt reports she has lasting soreness/pain following aquatic PT. She is having nerve pain down her L LE from hip to knee which lasts after PT sessions. It resolves when she stops activity. Does note some numbness into bilateral hands when laying down at times.   PERTINENT HISTORY:  Abdominal migraine, history of L5-S1 lumbar surgery,  history of C-section  PAIN:  Are you having pain? yes: NPRS scale: 1/10 Pain location: Thoracolumbar region Pain description: Aching gets worse throughout the day Aggravating factors: Increase in pain over time Relieving factors: Lay supine  PRECAUTIONS: None  WEIGHT BEARING RESTRICTIONS: No  FALLS:  Has patient fallen in last 6 months? 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    OBJECTIVE:   DIAGNOSTIC FINDINGS:  No reports available at evaluation  PATIENT SURVEYS:  FOTO 47  COGNITIVE STATUS: Within functional limits for tasks assessed   RED FLAGS: None    SENSATION: WFL  POSTURE:  rounded shoulders, increased lumbar lordosis, and decreased thoracic kyphosis Lt PSIS elevation, in supine: Lt post innom rotation resulting in functional LLD (Rt longer)    Body Part #1 Lumbar   LE MMT LOWER EXTREMITY MMT: Not appropriate to test at evaluation due to notable pelvic rotation  MMT Right 5/20 Left 5/20  Hip flexion 5/5 4-/5  Hip extension 5/5 3/5  Hip abduction 4+/5 4-/5  Hip adduction    Hip internal rotation    Hip external rotation    Knee flexion    Knee extension    Ankle dorsiflexion    Ankle plantarflexion    Ankle inversion    Ankle eversion     (Blank rows = not tested)    ROM: Patient demonstrates range of motion within functional limits but is very guarded in forward flexion utilizing hands as a brace on her legs  TREATMENT:                                                                                                                              Treatment                            5/20   MMT measurement L hip PROM STM L glutes L manual HSS with nerve glide (ankle pumps x15) Sidelying clam  2x10R Sidelying glute medius isometric 6" x5 Prone hip extension with knee flexed x10 R POE x23min Cat/cow in quadruped 5" hold x10 ea    12/25/22 Pt seen for aquatic therapy today.  Treatment took place in water 3.5-4.75 ft in depth at the [facility] pool. Temp of water was 91.  Pt entered/exited the pool independently via stairs using step through pattern with bilat hand rail.  * warm up of walking forward/backward  *L stretch; added tail wagging *Lumbopelvic mobilization/stretching seated on noodle: ant/post pelvic tilts; hip hiking; rotation * seated balance on yellow noodle with bilat hand out of water (easy) * plank position with hands on yellow noodle(push up to/from superman position), x 5;  then with alternating hip ext x 5 each  *Balance standing on noodle: rolling forward then back (heels to toes) gaining balance x 10; standing balance directly on noodle wide stance then repeated with feet together *wall push up/ offs x 10, cues for alignment *Side to side pendulum: instruction then x 7  - QL stretching R/L above position    Treatment  12/17/22:  Pt seen for aquatic therapy today.  Treatment took place in water 3.5-4.75 ft in depth at the [facility] pool. Temp of water was 91.  Pt entered/exited the pool independently via stairs using step through pattern with bilat hand rail.  * warm up of walking forward/backward  * side stepping without/with arm addct with rainbow hand floats;   *Cycling on noodle added aerobic capacity element with intervals  - add/abd; jumping jacks *TrA set using solid noodle wide stance then staggered *L stretch; tail wagging *Plank on yellow noodle *3 way stretch *sitting balance on noodle    Treatment                            12/10/22: Pt seen for aquatic therapy today.  Treatment took place in water 3.5-4.75 ft in depth at the [facility] pool. Temp of water was 91.  Pt entered/exited  the pool independently via stairs using step through pattern with bilat hand rail.  * warm up of walking forward/backward  * side stepping without/with arm addct with rainbow hand floats;   * TrA set with bilat and single rainbow hand float at side under water, walking forward/backward - 3 laps total * TrA set with short blue noodle pull down, with slow return to surface -- standing x 10; repeated with solid noodle x 5 * back against wall:  3 way LE stretch with ankle supported by solid noodle  * seated balance on yellow noodle with single hand out of water x 2 each, ( L hand lift a challenge) * plank position with hands on yellow noodle(push up to/from superman position), x 5;  then with alternating hip ext x 5 each  * opp arm / leg lift with hand x 5 each *wall push up/ offs x 10, cues for alignment * squats pushing rainbow hand float under water with cues for vertical trunk, ab set x 5    PATIENT EDUCATION:  Education details: Anatomy of condition, POC, HEP, exercise form/rationale Person educated: Patient Education method: Explanation, Demonstration, Tactile cues, Verbal cues, and Handouts Education comprehension: verbalized understanding, returned demonstration, verbal cues required, tactile cues required, and needs further education  HOME EXERCISE PROGRAM: Access Code: [email or web address] URL: [email or web address] Date: 12/25/2022 Prepared by: Geni Bers  Exercises - Side to Side Pendulum Swing with Foam Dumbbells and Ankle Floats  - 1 x daily - 7 x weekly - 3 sets - 10 reps - Forward Backward Pendulum Swings with Hip Abduction and Adduction  - 1 x daily - 7 x weekly - 3 sets - 10 reps - Plank on Long Hand Float  - 1 x daily - 7 x weekly - 3 sets - 10 reps - Plank on Long [employer] with Arm Lifts  - 1 x daily - 7 x weekly - 3 sets - 10 reps - Sitting Balance on Pool Noodle  - 1 x daily - 7 x weekly - 3 sets - 10 reps - Sit to Stand  - 1 x daily - 7 x weekly - 3 sets -  10 reps - Kick Board Row  - 1 x daily - 7 x weekly - 3 sets - 10 reps - Noodle press  - 1 x daily - 7 x weekly - 3 sets - 10 reps - Standing 'L' Stretch at [employer]  - 1 x daily - 7 x weekly - 3 sets - 10 reps - Squat  -  1 x daily - 7 x weekly - 3 sets - 10 reps - Standing Balance on Noodle at [employer]  - 1 x daily - 7 x weekly - 3 sets - 10 reps - Squat on Noodle  - 1 x daily - 7 x weekly - 3 sets - 10 reps   ASSESSMENT:  CLINICAL IMPRESSION: MMT reveals significant weakness in L LE compared to R LE. Worked on gentle L LE strengthening today with overall good tolerance. No pain reported with gentle lumbar extension, though she does report she has pain with this sometimes. Neural tension noted in L LE compared to R LE with supine HSS with ankle pump. Pt with good tolerance to nerve glide and instructed in this for HEP. Tender to palpation of L glutes, but no tenderness in thoracic area. Pt will benefit from continued core and hip strengthening.     OBJECTIVE IMPAIRMENTS: decreased activity tolerance, difficulty walking, decreased strength, increased muscle spasms, improper body mechanics, postural dysfunction, and pain.   ACTIVITY LIMITATIONS: carrying, lifting, bending, sitting, standing, stairs, bed mobility, locomotion level, and caring for others  PARTICIPATION LIMITATIONS: meal prep, cleaning, laundry, driving, shopping, community activity, occupation, and yard work  PERSONAL FACTORS: Past/current experiences and Time since onset of injury/illness/exacerbation are also affecting patient's functional outcome.   REHAB POTENTIAL: Good  CLINICAL DECISION MAKING: Evolving/moderate complexity  EVALUATION COMPLEXITY: Moderate   GOALS: Goals reviewed with patient? Yes  SHORT TERM GOALS: Target date: 5/10  Compliance with home exercise program as it has been established in the short-term Baseline: Goal status: MET  2.  Independent and utilization of self-correction techniques  for pelvic rotation correction Baseline:  Goal status: INITIAL    LONG TERM GOALS: Target date: POC date  Will meet Foto goal Baseline:  Goal status: INITIAL  2.  Bilateral hip abduction strength within age-appropriate range Baseline: Not appropriate to test at evaluation due to notable innominate rotation indicating poor muscular stability Goal status: INITIAL  3.  Able to ride the bike without radicular symptoms Baseline:  Goal status: INITIAL  4.  Able to utilize stretches, exercises, and self-care techniques to complete functional daily activities without limitation by pain Baseline:  Goal status: INITIAL    PLAN:  PT FREQUENCY: 1-2x/week  PT DURATION: other: Through POC date  PLANNED INTERVENTIONS: Therapeutic exercises, Therapeutic activity, Neuromuscular re-education, Balance training, Gait training, Patient/Family education, Self Care, Joint mobilization, Stair training, Aquatic Therapy, Dry Needling, Electrical stimulation, Spinal mobilization, Cryotherapy, Moist heat, Taping, Ultrasound, Ionotophoresis 4mg /ml Dexamethasone, Manual therapy, and Re-evaluation.  PLAN FOR NEXT SESSION: Dry needling, core activation, lumbar multifidi stabilization  Riki Altes, PTA  12/29/22 11:05 AM [facility] [address] [place], [place], [address] Phone: [phone]   Fax:  [phone]

## 2023-01-01 ENCOUNTER — Ambulatory Visit (HOSPITAL_BASED_OUTPATIENT_CLINIC_OR_DEPARTMENT_OTHER): Payer: BC Managed Care – PPO | Admitting: Physical Therapy

## 2023-01-01 DIAGNOSIS — M6281 Muscle weakness (generalized): Secondary | ICD-10-CM

## 2023-01-01 DIAGNOSIS — M5459 Other low back pain: Secondary | ICD-10-CM | POA: Diagnosis not present

## 2023-01-01 DIAGNOSIS — R293 Abnormal posture: Secondary | ICD-10-CM

## 2023-01-01 DIAGNOSIS — R262 Difficulty in walking, not elsewhere classified: Secondary | ICD-10-CM

## 2023-01-01 NOTE — Therapy (Signed)
OUTPATIENT PHYSICAL THERAPY TREATMENT   Patient Name: Mary Russell MRN: [id number] DOB:12/05/1975, 47 y.o., female Today's Date: 01/01/2023  END OF SESSION:  PT End of Session - 01/01/23 1028     Visit Number 9    Number of Visits 33    Date for PT Re-Evaluation 03/13/23    PT Start Time 0945    PT Stop Time 1030    PT Time Calculation (min) 45 min    Activity Tolerance Patient tolerated treatment well    Behavior During Therapy [facility] for tasks assessed/performed                Past Medical History:  Diagnosis Date   Abdominal migraine    Anemia    Anxiety    Chronic migraine    Depression    Lumbar disc disease    Palpitations    Thyroid disease    Hoshimotos   Past Surgical History:  Procedure Laterality Date   CESAREAN SECTION  08/2004   06/2007   LUMBAR DISC SURGERY  03/2019   L5-S1   Patient Active Problem List   Diagnosis Date Noted   Thyroid disease    Palpitations    Abdominal migraine    Chronic migraine    Lumbar disc disease    Lumbar radiculopathy 04/30/2020     REFERRING PROVIDER:  Venita Lick, MD   REFERRING DIAG: M54.51 (ICD-10-CM) - Vertebrogenic low back pain   Rationale for Evaluation and Treatment: Rehabilitation  THERAPY DIAG:  Other low back pain  Difficulty in walking, not elsewhere classified  Muscle weakness (generalized)  Abnormal posture  ONSET DATE: approx 2020   SUBJECTIVE:                                                                                                                                                                                           SUBJECTIVE STATEMENT: Pt reports she had a busy day yesterday, with cleaning/cooking/lifting dog for appt. Pain was up to 5/10 last night.  "I feel discouraged".  Pt unsure what exercises in particular are aggravating symptoms, as she doesn't have pain during, but has pain the evening following.  The night and day following last aquatic session she had  increased pain, more than usual.   PERTINENT HISTORY:  Abdominal migraine, history of L5-S1 lumbar surgery, history of C-section  PAIN:  Are you having pain? yes: NPRS scale: 2/10 Pain location: Thoracolumbar region Pain description: Aching gets worse throughout the day Aggravating factors: Increase in pain over time Relieving factors: Lay supine  PRECAUTIONS: None  WEIGHT BEARING RESTRICTIONS: No  FALLS:  Has patient fallen in last  6 months? 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    OBJECTIVE:   DIAGNOSTIC FINDINGS:  No reports available at evaluation  PATIENT SURVEYS:  FOTO 47  COGNITIVE STATUS: Within functional limits for tasks assessed   RED FLAGS: None    SENSATION: WFL  POSTURE:  rounded shoulders, increased lumbar lordosis, and decreased thoracic kyphosis Lt PSIS elevation, in supine: Lt post innom rotation resulting in functional LLD (Rt longer)    Body Part #1 Lumbar   LE MMT LOWER EXTREMITY MMT: Not appropriate to test at evaluation due to notable pelvic rotation  MMT Right 5/20 Left 5/20  Hip flexion 5/5 4-/5  Hip extension 5/5 3/5  Hip abduction 4+/5 4-/5  Hip adduction    Hip internal rotation    Hip external rotation    Knee flexion    Knee extension    Ankle dorsiflexion    Ankle plantarflexion    Ankle inversion    Ankle eversion     (Blank rows = not tested)    ROM: Patient demonstrates range of motion within functional limits but is very guarded in forward flexion utilizing hands as a brace on her legs  TREATMENT:                                                                                                                             Treatment                             01/01/23: Pt seen for aquatic therapy today.  Treatment took place in water 3.5-4.75 ft in depth at the [facility] pool. Temp of water was 91.  Pt entered/exited the pool independently via stairs using step through pattern with bilat hand rail.  * warm up of walking forward/backward  * side stepping without/with arm addct with rainbow hand floats;  * wall push up/off x 10 * staggered stance with bilat horiz abdct addct 2 x 5, ( first slow, 2nd with increased speed) * TrA set with solid noodle pull down, with slow return to surface x 5 in each staggered stance and standard stance * PNF diagonal motion with holding small yellow ball, swiveling in hips 2 x 5 * anti-rotation side step holding rider band at just under 4 ft x 5 each side * plank position at bench - alternating hip ext x 5, then opp arm/leg lifts x 5 each (good tolerance) * return to marching  * holding yellow noodle: alternating 1/2 diamonds x 5 each side * cycling at varied speeds in deeper water (straddling noodle) * back against wall:  3 way LE stretch with ankle supported by blue noodle with  hole   Treatment                            5/20   MMT measurement L hip PROM STM L glutes L manual HSS with nerve glide (ankle pumps x15) Sidelying clam 2x10R Sidelying glute medius isometric 6" x5 Prone hip extension with knee flexed x10 R POE x3min Cat/cow in quadruped 5" hold x10 ea    12/25/22 Pt seen for aquatic therapy today.  Treatment took place in water 3.5-4.75 ft in depth at the [facility] pool. Temp of water was 91.  Pt entered/exited the pool independently via stairs using step through pattern with bilat hand rail.  * warm up of walking forward/backward  *L stretch; added tail wagging *Lumbopelvic mobilization/stretching seated on noodle: ant/post pelvic tilts; hip hiking; rotation * seated balance on yellow noodle with bilat hand out of water (easy) * plank position with hands on yellow  noodle(push up to/from superman position), x 5;  then with alternating hip ext x 5 each  *Balance standing on noodle: rolling forward then back (heels to toes) gaining balance x 10; standing balance directly on noodle wide stance then repeated with feet together *wall push up/ offs x 10, cues for alignment *Side to side pendulum: instruction then x 7  - QL stretching R/L above position    Treatment                            12/17/22:  Pt seen for aquatic therapy today.  Treatment took place in water 3.5-4.75 ft in depth at the [facility] pool. Temp of water was 91.  Pt entered/exited the pool independently via stairs using step through pattern with bilat hand rail.  * warm up of walking forward/backward  * side stepping without/with arm addct with rainbow hand floats;   *Cycling on noodle added aerobic capacity element with intervals  - add/abd; jumping jacks *TrA set using solid noodle wide stance then staggered *L stretch; tail wagging *Plank on yellow noodle *3 way stretch *sitting balance on noodle    Treatment                            12/10/22: Pt seen for aquatic therapy today.  Treatment took place in water 3.5-4.75 ft in depth at the [facility] pool. Temp of water was 91.  Pt entered/exited the pool independently via stairs using step through pattern with bilat hand rail.  * warm up of walking forward/backward  * side stepping without/with arm addct with rainbow hand floats;   * TrA set with bilat and single rainbow hand float at side under water, walking forward/backward - 3 laps total * TrA set with short blue noodle pull down, with slow return to surface -- standing x 10; repeated with solid noodle x 5 * back against wall:  3 way LE stretch with ankle supported by solid noodle  * seated balance on yellow noodle with single hand out of water x 2 each, ( L hand lift a challenge) * plank position with hands on yellow noodle(push up to/from superman  position), x 5;  then with alternating hip ext x 5 each  * opp arm / leg lift with hand x 5 each *wall push up/ offs x 10, cues for alignment * squats pushing rainbow hand float under water with cues for  vertical trunk, ab set x 5    PATIENT EDUCATION:  Education details: Anatomy of condition, POC, HEP, exercise form/rationale Person educated: Patient Education method: Explanation, Demonstration, Tactile cues, Verbal cues, and Handouts Education comprehension: verbalized understanding, returned demonstration, verbal cues required, tactile cues required, and needs further education  HOME EXERCISE PROGRAM: Access Code: [email or web address] URL: [email or web address] Date: 12/25/2022 Prepared by: Geni Bers  Exercises - Side to Side Pendulum Swing with Foam Dumbbells and Ankle Floats  - 1 x daily - 7 x weekly - 3 sets - 10 reps - Forward Backward Pendulum Swings with Hip Abduction and Adduction  - 1 x daily - 7 x weekly - 3 sets - 10 reps - Plank on Long Hand Float  - 1 x daily - 7 x weekly - 3 sets - 10 reps - Plank on Long [employer] with Arm Lifts  - 1 x daily - 7 x weekly - 3 sets - 10 reps - Sitting Balance on Pool Noodle  - 1 x daily - 7 x weekly - 3 sets - 10 reps - Sit to Stand  - 1 x daily - 7 x weekly - 3 sets - 10 reps - Kick Board Row  - 1 x daily - 7 x weekly - 3 sets - 10 reps - Noodle press  - 1 x daily - 7 x weekly - 3 sets - 10 reps - Standing 'L' Stretch at [employer]  - 1 x daily - 7 x weekly - 3 sets - 10 reps - Squat  - 1 x daily - 7 x weekly - 3 sets - 10 reps - Standing Balance on Noodle at [employer]  - 1 x daily - 7 x weekly - 3 sets - 10 reps - Squat on Noodle  - 1 x daily - 7 x weekly - 3 sets - 10 reps   ASSESSMENT:  CLINICAL IMPRESSION: Varied exercises today to figure out which have been aggravating to mid and lower back.  Will continue to hold off on pendulums as she did note that was one that irritated back. She reported tightening in her lower  back when increasing speed of cycling while on noodle; stopped and switched to LE stretches. Pt does report she gets a warning signal of "pinching" sensation in Lt groin prior to LLE radicular symptoms happening.  This did not happen today, but wanted to make therapists aware.   Pt will benefit from continued core and hip strengthening. Pt progressing gradually towards LTGs.  FOTO next visit.     OBJECTIVE IMPAIRMENTS: decreased activity tolerance, difficulty walking, decreased strength, increased muscle spasms, improper body mechanics, postural dysfunction, and pain.   ACTIVITY LIMITATIONS: carrying, lifting, bending, sitting, standing, stairs, bed mobility, locomotion level, and caring for others  PARTICIPATION LIMITATIONS: meal prep, cleaning, laundry, driving, shopping, community activity, occupation, and yard work  PERSONAL FACTORS: Past/current experiences and Time since onset of injury/illness/exacerbation are also affecting patient's functional outcome.   REHAB POTENTIAL: Good  CLINICAL DECISION MAKING: Evolving/moderate complexity  EVALUATION COMPLEXITY: Moderate   GOALS: Goals reviewed with patient? Yes  SHORT TERM GOALS: Target date: 5/10  Compliance with home exercise program as it has been established in the short-term Baseline: Goal status: MET  2.  Independent and utilization of self-correction techniques for pelvic rotation correction Baseline:  Goal status: INITIAL    LONG TERM GOALS: Target date: POC date  Will meet Foto goal Baseline:  Goal status: INITIAL  2.  Bilateral hip abduction  strength within age-appropriate range Baseline: Not appropriate to test at evaluation due to notable innominate rotation indicating poor muscular stability Goal status: INITIAL  3.  Able to ride the bike without radicular symptoms Baseline:  Goal status: INITIAL  4.  Able to utilize stretches, exercises, and self-care techniques to complete functional daily activities  without limitation by pain Baseline:  Goal status: INITIAL    PLAN:  PT FREQUENCY: 1-2x/week  PT DURATION: other: Through POC date  PLANNED INTERVENTIONS: Therapeutic exercises, Therapeutic activity, Neuromuscular re-education, Balance training, Gait training, Patient/Family education, Self Care, Joint mobilization, Stair training, Aquatic Therapy, Dry Needling, Electrical stimulation, Spinal mobilization, Cryotherapy, Moist heat, Taping, Ultrasound, Ionotophoresis 4mg /ml Dexamethasone, Manual therapy, and Re-evaluation.  PLAN FOR NEXT SESSION: Dry needling (land, as indicated), core activation, lumbar multifidi stabilization  Mayer Camel, PTA 01/01/23 10:34 AM [facility] [address] [place], [place], [address] Phone: [phone]   Fax:  [phone]

## 2023-01-06 ENCOUNTER — Encounter (HOSPITAL_BASED_OUTPATIENT_CLINIC_OR_DEPARTMENT_OTHER): Payer: BC Managed Care – PPO

## 2023-01-08 ENCOUNTER — Ambulatory Visit (HOSPITAL_BASED_OUTPATIENT_CLINIC_OR_DEPARTMENT_OTHER): Payer: BC Managed Care – PPO | Admitting: Physical Therapy

## 2023-01-08 ENCOUNTER — Encounter (HOSPITAL_BASED_OUTPATIENT_CLINIC_OR_DEPARTMENT_OTHER): Payer: Self-pay | Admitting: Physical Therapy

## 2023-01-08 DIAGNOSIS — M5459 Other low back pain: Secondary | ICD-10-CM

## 2023-01-08 DIAGNOSIS — R262 Difficulty in walking, not elsewhere classified: Secondary | ICD-10-CM

## 2023-01-08 DIAGNOSIS — M6281 Muscle weakness (generalized): Secondary | ICD-10-CM

## 2023-01-08 DIAGNOSIS — R293 Abnormal posture: Secondary | ICD-10-CM

## 2023-01-08 NOTE — Therapy (Signed)
OUTPATIENT PHYSICAL THERAPY TREATMENT   Patient Name: Mary Russell MRN: [id number] DOB:April 30, 1976, 47 y.o., female Today's Date: 01/08/2023  END OF SESSION:  PT End of Session - 01/08/23 1030     Visit Number 10    Number of Visits 33    Date for PT Re-Evaluation 03/13/23    PT Start Time 0946    PT Stop Time 1025    PT Time Calculation (min) 39 min    Activity Tolerance Patient tolerated treatment well    Behavior During Therapy [facility] for tasks assessed/performed                 Past Medical History:  Diagnosis Date   Abdominal migraine    Anemia    Anxiety    Chronic migraine    Depression    Lumbar disc disease    Palpitations    Thyroid disease    [place]   Past Surgical History:  Procedure Laterality Date   CESAREAN SECTION  08/2004   06/2007   LUMBAR DISC SURGERY  03/2019   L5-S1   Patient Active Problem List   Diagnosis Date Noted   Thyroid disease    Palpitations    Abdominal migraine    Chronic migraine    Lumbar disc disease    Lumbar radiculopathy 04/30/2020     REFERRING PROVIDER:  Venita Lick, MD   REFERRING DIAG: M54.51 (ICD-10-CM) - Vertebrogenic low back pain   Rationale for Evaluation and Treatment: Rehabilitation  THERAPY DIAG:  Other low back pain  Difficulty in walking, not elsewhere classified  Muscle weakness (generalized)  Abnormal posture  ONSET DATE: approx 2020   SUBJECTIVE:                                                                                                                                                                                           SUBJECTIVE STATEMENT: Stiff in lb today not a lot of pain    PERTINENT HISTORY:  Abdominal migraine, history of L5-S1 lumbar surgery, history of C-section  PAIN:  Are you having pain? yes: NPRS scale: 1/10 Pain location: Thoracolumbar region Pain description: Aching gets worse throughout the day Aggravating factors: Increase in pain over  time Relieving factors: Lay supine  PRECAUTIONS: None  WEIGHT BEARING RESTRICTIONS: No  FALLS:  Has patient fallen in last 6 months? 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  OBJECTIVE:   DIAGNOSTIC FINDINGS:  No reports available at evaluation  PATIENT SURVEYS:  FOTO 47  COGNITIVE STATUS: Within functional limits for tasks assessed   RED FLAGS: None    SENSATION: WFL  POSTURE:  rounded shoulders, increased lumbar lordosis, and decreased thoracic kyphosis Lt PSIS elevation, in supine: Lt post innom rotation resulting in functional LLD (Rt longer)    Body Part #1 Lumbar   LE MMT LOWER EXTREMITY MMT: Not appropriate to test at evaluation due to notable pelvic rotation  MMT Right 5/20 Left 5/20  Hip flexion 5/5 4-/5  Hip extension 5/5 3/5  Hip abduction 4+/5 4-/5  Hip adduction    Hip internal rotation    Hip external rotation    Knee flexion    Knee extension    Ankle dorsiflexion    Ankle plantarflexion    Ankle inversion    Ankle eversion     (Blank rows = not tested)    ROM: Patient demonstrates range of motion within functional limits but is very guarded in forward flexion utilizing hands as a brace on her legs  TREATMENT:                                                                                                                             Treatment                            01/08/23: Pt seen for aquatic therapy today.  Treatment took place in water 3.5-4.75 ft in depth at the [facility] pool. Temp of water was 91.  Pt entered/exited the pool independently via stairs using step through pattern with bilat hand rail.  * warm up of walking forward/backward  *Planks: holding position x 30 s x  2; arm raises x 5; leg raises x5 - -side plank.  VC and demonstration for proper execution holding x 30s; then with leg lifts *bird dog on bench x 10  - repeated on water mat x5 (surface too soft for proper execution);  paddle board x 5 R/L *side to side pendulums: x 4 pausing to gain QL stretch;x 8 for core engagement *superman position for stretch ue support using yellow HB; then upright fly x 5. * side stepping without/with arm addct with rainbow hand floats;   Treatment                            01/01/23: Pt seen for aquatic therapy today.  Treatment took place in water 3.5-4.75 ft in depth at the [facility] pool. Temp of water was 91.  Pt entered/exited the pool independently via stairs using step through pattern with bilat hand rail.   * warm up of walking forward/backward  * side stepping without/with arm addct with rainbow hand floats;  * wall push up/off x 10 * staggered stance with bilat horiz abdct addct 2  x 5, ( first slow, 2nd with increased speed) * TrA set with solid noodle pull down, with slow return to surface x 5 in each staggered stance and standard stance * PNF diagonal motion with holding small yellow ball, swiveling in hips 2 x 5 * anti-rotation side step holding rider band at just under 4 ft x 5 each side * plank position at bench - alternating hip ext x 5, then opp arm/leg lifts x 5 each (good tolerance) * return to marching  * holding yellow noodle: alternating 1/2 diamonds x 5 each side * cycling at varied speeds in deeper water (straddling noodle) * back against wall:  3 way LE stretch with ankle supported by blue noodle with hole  Treatment                            5/20   MMT measurement L hip PROM STM L glutes L manual HSS with nerve glide (ankle pumps x15) Sidelying clam 2x10R Sidelying glute medius isometric 6" x5 Prone hip extension with knee flexed x10 R POE x78min Cat/cow in quadruped 5" hold x10 ea    12/25/22 Pt seen for aquatic  therapy today.  Treatment took place in water 3.5-4.75 ft in depth at the [facility] pool. Temp of water was 91.  Pt entered/exited the pool independently via stairs using step through pattern with bilat hand rail.  * warm up of walking forward/backward  *L stretch; added tail wagging *Lumbopelvic mobilization/stretching seated on noodle: ant/post pelvic tilts; hip hiking; rotation * seated balance on yellow noodle with bilat hand out of water (easy) * plank position with hands on yellow noodle(push up to/from superman position), x 5;  then with alternating hip ext x 5 each  *Balance standing on noodle: rolling forward then back (heels to toes) gaining balance x 10; standing balance directly on noodle wide stance then repeated with feet together *wall push up/ offs x 10, cues for alignment *Side to side pendulum: instruction then x 7  - QL stretching R/L above position    Treatment                            12/17/22:  Pt seen for aquatic therapy today.  Treatment took place in water 3.5-4.75 ft in depth at the [facility] pool. Temp of water was 91.  Pt entered/exited the pool independently via stairs using step through pattern with bilat hand rail.  * warm up of walking forward/backward  * side stepping without/with arm addct with rainbow hand floats;   *Cycling on noodle added aerobic capacity element with intervals  - add/abd; jumping jacks *TrA set using solid noodle wide stance then staggered *L stretch; tail wagging *Plank on yellow noodle *3 way stretch *sitting balance on noodle    Treatment                            12/10/22: Pt seen for aquatic therapy today.  Treatment took place in water 3.5-4.75 ft in depth at the [facility] pool. Temp of water was 91.  Pt entered/exited the pool independently via stairs using step through pattern with bilat hand rail.  * warm up of walking forward/backward  * side stepping without/with arm addct with  rainbow hand floats;   * TrA set with bilat and single rainbow hand float at side under  water, walking forward/backward - 3 laps total * TrA set with short blue noodle pull down, with slow return to surface -- standing x 10; repeated with solid noodle x 5 * back against wall:  3 way LE stretch with ankle supported by solid noodle  * seated balance on yellow noodle with single hand out of water x 2 each, ( L hand lift a challenge) * plank position with hands on yellow noodle(push up to/from superman position), x 5;  then with alternating hip ext x 5 each  * opp arm / leg lift with hand x 5 each *wall push up/ offs x 10, cues for alignment * squats pushing rainbow hand float under water with cues for vertical trunk, ab set x 5    PATIENT EDUCATION:  Education details: Anatomy of condition, POC, HEP, exercise form/rationale Person educated: Patient Education method: Explanation, Demonstration, Tactile cues, Verbal cues, and Handouts Education comprehension: verbalized understanding, returned demonstration, verbal cues required, tactile cues required, and needs further education  HOME EXERCISE PROGRAM: Access Code: [email or web address] URL: [email or web address] Date: 12/25/2022 Prepared by: Geni Bers  Exercises - Side to Side Pendulum Swing with Foam Dumbbells and Ankle Floats  - 1 x daily - 7 x weekly - 3 sets - 10 reps - Forward Backward Pendulum Swings with Hip Abduction and Adduction  - 1 x daily - 7 x weekly - 3 sets - 10 reps - Plank on Long Hand Float  - 1 x daily - 7 x weekly - 3 sets - 10 reps - Plank on Long [employer] with Arm Lifts  - 1 x daily - 7 x weekly - 3 sets - 10 reps - Sitting Balance on Pool Noodle  - 1 x daily - 7 x weekly - 3 sets - 10 reps - Sit to Stand  - 1 x daily - 7 x weekly - 3 sets - 10 reps - Kick Board Row  - 1 x daily - 7 x weekly - 3 sets - 10 reps - Noodle press  - 1 x daily - 7 x weekly - 3 sets - 10 reps - Standing 'L' Stretch at [employer]   - 1 x daily - 7 x weekly - 3 sets - 10 reps - Squat  - 1 x daily - 7 x weekly - 3 sets - 10 reps - Standing Balance on Noodle at [employer]  - 1 x daily - 7 x weekly - 3 sets - 10 reps - Squat on Noodle  - 1 x daily - 7 x weekly - 3 sets - 10 reps   ASSESSMENT:  CLINICAL IMPRESSION: Focused on multifidi strengthening for core stability. She tolerated very well demonstrating good balance ability in quadriped on floating surfaces. Very good challenge for pt.  No reports of pain throughout.  Goals ongoing  Varied exercises today to figure out which have been aggravating to mid and lower back.  Will continue to hold off on pendulums as she did note that was one that irritated back. She reported tightening in her lower back when increasing speed of cycling while on noodle; stopped and switched to LE stretches. Pt does report she gets a warning signal of "pinching" sensation in Lt groin prior to LLE radicular symptoms happening.  This did not happen today, but wanted to make therapists aware.   Pt will benefit from continued core and hip strengthening. Pt progressing gradually towards LTGs.  FOTO next visit.     OBJECTIVE IMPAIRMENTS: decreased  activity tolerance, difficulty walking, decreased strength, increased muscle spasms, improper body mechanics, postural dysfunction, and pain.   ACTIVITY LIMITATIONS: carrying, lifting, bending, sitting, standing, stairs, bed mobility, locomotion level, and caring for others  PARTICIPATION LIMITATIONS: meal prep, cleaning, laundry, driving, shopping, community activity, occupation, and yard work  PERSONAL FACTORS: Past/current experiences and Time since onset of injury/illness/exacerbation are also affecting patient's functional outcome.   REHAB POTENTIAL: Good  CLINICAL DECISION MAKING: Evolving/moderate complexity  EVALUATION COMPLEXITY: Moderate   GOALS: Goals reviewed with patient? Yes  SHORT TERM GOALS: Target date: 5/10  Compliance with home  exercise program as it has been established in the short-term Baseline: Goal status: MET  2.  Independent and utilization of self-correction techniques for pelvic rotation correction Baseline:  Goal status: INITIAL    LONG TERM GOALS: Target date: POC date  Will meet Foto goal Baseline:  Goal status: INITIAL  2.  Bilateral hip abduction strength within age-appropriate range Baseline: Not appropriate to test at evaluation due to notable innominate rotation indicating poor muscular stability Goal status: INITIAL  3.  Able to ride the bike without radicular symptoms Baseline:  Goal status: INITIAL  4.  Able to utilize stretches, exercises, and self-care techniques to complete functional daily activities without limitation by pain Baseline:  Goal status: INITIAL    PLAN:  PT FREQUENCY: 1-2x/week  PT DURATION: other: Through POC date  PLANNED INTERVENTIONS: Therapeutic exercises, Therapeutic activity, Neuromuscular re-education, Balance training, Gait training, Patient/Family education, Self Care, Joint mobilization, Stair training, Aquatic Therapy, Dry Needling, Electrical stimulation, Spinal mobilization, Cryotherapy, Moist heat, Taping, Ultrasound, Ionotophoresis 4mg /ml Dexamethasone, Manual therapy, and Re-evaluation.  PLAN FOR NEXT SESSION: Dry needling (land, as indicated), core activation, lumbar multifidi stabilization  Rushie Chestnut) Alysia Scism MPT 01/08/23 10:31 AM [facility] [address] [place], [place], [address] Phone: [phone]   Fax:  [phone]

## 2023-01-13 ENCOUNTER — Ambulatory Visit (HOSPITAL_BASED_OUTPATIENT_CLINIC_OR_DEPARTMENT_OTHER): Payer: BC Managed Care – PPO | Admitting: Physical Therapy

## 2023-01-15 ENCOUNTER — Ambulatory Visit (HOSPITAL_BASED_OUTPATIENT_CLINIC_OR_DEPARTMENT_OTHER): Payer: BC Managed Care – PPO | Admitting: Physical Therapy

## 2023-01-20 ENCOUNTER — Encounter (HOSPITAL_BASED_OUTPATIENT_CLINIC_OR_DEPARTMENT_OTHER): Payer: BC Managed Care – PPO | Admitting: Physical Therapy

## 2023-01-22 ENCOUNTER — Ambulatory Visit (HOSPITAL_BASED_OUTPATIENT_CLINIC_OR_DEPARTMENT_OTHER): Payer: BC Managed Care – PPO | Attending: Orthopedic Surgery | Admitting: Physical Therapy

## 2023-01-22 ENCOUNTER — Encounter (HOSPITAL_BASED_OUTPATIENT_CLINIC_OR_DEPARTMENT_OTHER): Payer: Self-pay | Admitting: Physical Therapy

## 2023-01-22 DIAGNOSIS — M5459 Other low back pain: Secondary | ICD-10-CM | POA: Insufficient documentation

## 2023-01-22 DIAGNOSIS — R262 Difficulty in walking, not elsewhere classified: Secondary | ICD-10-CM | POA: Insufficient documentation

## 2023-01-22 DIAGNOSIS — R293 Abnormal posture: Secondary | ICD-10-CM | POA: Insufficient documentation

## 2023-01-22 DIAGNOSIS — M6281 Muscle weakness (generalized): Secondary | ICD-10-CM | POA: Diagnosis present

## 2023-01-22 NOTE — Therapy (Signed)
OUTPATIENT PHYSICAL THERAPY TREATMENT   Patient Name: Mary Russell MRN: [id number] DOB:Jan 02, 1976, 47 y.o., female Today's Date: 01/22/2023  END OF SESSION:  PT End of Session - 01/22/23 0938     Visit Number 11    Number of Visits 33    Date for PT Re-Evaluation 03/13/23    PT Start Time 0941    PT Stop Time 1025    PT Time Calculation (min) 44 min    Behavior During Therapy [facility] for tasks assessed/performed                 Past Medical History:  Diagnosis Date   Abdominal migraine    Anemia    Anxiety    Chronic migraine    Depression    Lumbar disc disease    Palpitations    Thyroid disease    Hoshimotos   Past Surgical History:  Procedure Laterality Date   CESAREAN SECTION  08/2004   06/2007   LUMBAR DISC SURGERY  03/2019   L5-S1   Patient Active Problem List   Diagnosis Date Noted   Thyroid disease    Palpitations    Abdominal migraine    Chronic migraine    Lumbar disc disease    Lumbar radiculopathy 04/30/2020     REFERRING PROVIDER:  Venita Lick, MD   REFERRING DIAG: M54.51 (ICD-10-CM) - Vertebrogenic low back pain   Rationale for Evaluation and Treatment: Rehabilitation  THERAPY DIAG:  Other low back pain  Difficulty in walking, not elsewhere classified  Muscle weakness (generalized)  Abnormal posture  ONSET DATE: approx 2020   SUBJECTIVE:                                                                                                                                                                                           SUBJECTIVE STATEMENT: Pt reports she has done water aerobics 2x in the last 2 days.  She had taken time off from working out and she noticed her back started to feel better. She reports good tolerance to last aquatic session.   She is not sure if she should try riding bicycle (goal related) because this usually increases her pain for a few days.     PERTINENT HISTORY:  Abdominal migraine, history of  L5-S1 lumbar surgery, history of C-section  PAIN:  Are you having pain? yes: NPRS scale: 1/10 Pain location: Thoracolumbar region Pain description: Aching gets worse throughout the day Aggravating factors: Increase in pain over time Relieving factors: Lay supine  PRECAUTIONS: None  WEIGHT BEARING RESTRICTIONS: No  FALLS:  Has patient fallen in last 6 months? 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    OBJECTIVE:   DIAGNOSTIC FINDINGS:  No reports available at evaluation  PATIENT SURVEYS:  FOTO 47  01/22/23: 53;  goal of 57 COGNITIVE STATUS: Within functional limits for tasks assessed   RED FLAGS: None    SENSATION: WFL  POSTURE:  rounded shoulders, increased lumbar lordosis, and decreased thoracic kyphosis Lt PSIS elevation, in supine: Lt post innom rotation resulting in functional LLD (Rt longer)    Body Part #1 Lumbar   LE MMT LOWER EXTREMITY MMT: Not appropriate to test at evaluation due to notable pelvic rotation  MMT Right 5/20 Left 5/20  Hip flexion 5/5 4-/5  Hip extension 5/5 3/5  Hip abduction 4+/5 4-/5  Hip adduction    Hip internal rotation    Hip external rotation    Knee flexion    Knee extension    Ankle dorsiflexion    Ankle plantarflexion    Ankle inversion    Ankle eversion     (Blank rows = not tested)    ROM: Patient demonstrates range of motion within functional limits but is very guarded in forward flexion utilizing hands as a brace on her legs  TREATMENT:                                                                                                                             01/22/23 Pt seen for aquatic therapy today.  Treatment took place in water 3.5-4.75 ft in depth at the [facility] pool. Temp of water was 91.  Pt entered/exited the pool independently via stairs using step through pattern with bilat hand rail.   * warm up of walking forward/backward ; light jog forward/ backward * side stepping with yellow hand float arm addct *Plank with hands on bench:  arm raises x 5; leg raises x5; bird dog x 10 *side to side pendulums: x 4 pausing to gain QL stretch;x 8 for core engagement * with yellow hand floats: plank position to/from superman * cycling in deeper water suspended by yellow hand floats under water at side x 2 min * marching forward/ backward with yellow hand floats at side * back against wall:  3 way LE stretch with ankle supported by blue noodle with hole  Treatment                            01/08/23: Pt seen for aquatic therapy today.  Treatment took place in water 3.5-4.75 ft in depth at the [facility] pool. Temp of water was 91.  Pt entered/exited the pool independently via stairs using step through pattern with bilat hand rail.  * warm up of walking  forward/backward  *Planks: holding position x 30 s x 2; arm raises x 5; leg raises x5 - -side plank.  VC and demonstration for proper execution holding x 30s; then with leg lifts *bird dog on bench x 10  - repeated on water mat x5 (surface too soft for proper execution);  paddle board x 5 R/L *side to side pendulums: x 4 pausing to gain QL stretch;x 8 for core engagement *superman position for stretch ue support using yellow HB; then upright fly x 5. * side stepping without/with arm addct with rainbow hand floats;   Treatment                            01/01/23: Pt seen for aquatic therapy today.  Treatment took place in water 3.5-4.75 ft in depth at the [facility] pool. Temp of water was 91.  Pt entered/exited the pool independently via stairs using step through pattern with bilat hand rail.   * warm up of walking forward/backward  * side stepping without/with arm addct with  rainbow hand floats;  * wall push up/off x 10 * staggered stance with bilat horiz abdct addct 2 x 5, ( first slow, 2nd with increased speed) * TrA set with solid noodle pull down, with slow return to surface x 5 in each staggered stance and standard stance * PNF diagonal motion with holding small yellow ball, swiveling in hips 2 x 5 * anti-rotation side step holding rider band at just under 4 ft x 5 each side * plank position at bench - alternating hip ext x 5, then opp arm/leg lifts x 5 each (good tolerance) * return to marching  * holding yellow noodle: alternating 1/2 diamonds x 5 each side * cycling at varied speeds in deeper water (straddling noodle) * back against wall:  3 way LE stretch with ankle supported by blue noodle with hole  Treatment                            5/20   MMT measurement L hip PROM STM L glutes L manual HSS with nerve glide (ankle pumps x15) Sidelying clam 2x10R Sidelying glute medius isometric 6" x5 Prone hip extension with knee flexed x10 R POE x49min Cat/cow in quadruped 5" hold x10 ea    12/25/22 Pt seen for aquatic therapy today.  Treatment took place in water 3.5-4.75 ft in depth at the [facility] pool. Temp of water was 91.  Pt entered/exited the pool independently via stairs using step through pattern with bilat hand rail.  * warm up of walking forward/backward  *L stretch; added tail wagging *Lumbopelvic mobilization/stretching seated on noodle: ant/post pelvic tilts; hip hiking; rotation * seated balance on yellow noodle with bilat hand out of water (easy) * plank position with hands on yellow noodle(push up to/from superman position), x 5;  then with alternating hip ext x 5 each  *Balance standing on noodle: rolling forward then back (heels to toes) gaining balance x 10; standing balance directly on noodle wide stance then repeated with feet together *wall push up/ offs x 10, cues for alignment *Side to side pendulum: instruction then  x 7  - QL stretching R/L above position    Treatment                            12/17/22:  Pt  seen for aquatic therapy today.  Treatment took place in water 3.5-4.75 ft in depth at the [facility] pool. Temp of water was 91.  Pt entered/exited the pool independently via stairs using step through pattern with bilat hand rail.  * warm up of walking forward/backward  * side stepping without/with arm addct with rainbow hand floats;   *Cycling on noodle added aerobic capacity element with intervals  - add/abd; jumping jacks *TrA set using solid noodle wide stance then staggered *L stretch; tail wagging *Plank on yellow noodle *3 way stretch *sitting balance on noodle    Treatment                            12/10/22: Pt seen for aquatic therapy today.  Treatment took place in water 3.5-4.75 ft in depth at the [facility] pool. Temp of water was 91.  Pt entered/exited the pool independently via stairs using step through pattern with bilat hand rail.  * warm up of walking forward/backward  * side stepping without/with arm addct with rainbow hand floats;   * TrA set with bilat and single rainbow hand float at side under water, walking forward/backward - 3 laps total * TrA set with short blue noodle pull down, with slow return to surface -- standing x 10; repeated with solid noodle x 5 * back against wall:  3 way LE stretch with ankle supported by solid noodle  * seated balance on yellow noodle with single hand out of water x 2 each, ( L hand lift a challenge) * plank position with hands on yellow noodle(push up to/from superman position), x 5;  then with alternating hip ext x 5 each  * opp arm / leg lift with hand x 5 each *wall push up/ offs x 10, cues for alignment * squats pushing rainbow hand float under water with cues for vertical trunk, ab set x 5    PATIENT EDUCATION:  Education details: exercise form/rationale Person educated: Patient Education method:  Explanation, Demonstration, Tactile cues, Verbal cues, Education comprehension: verbalized understanding, returned demonstration, verbal cues required, tactile cues required, and needs further education  HOME EXERCISE PROGRAM: Access Code: [email or web address] URL: [email or web address] Date: 12/25/2022 Prepared by: Geni Bers  Exercises - Side to Side Pendulum Swing with Foam Dumbbells and Ankle Floats  - 1 x daily - 7 x weekly - 3 sets - 10 reps - Forward Backward Pendulum Swings with Hip Abduction and Adduction  - 1 x daily - 7 x weekly - 3 sets - 10 reps - Plank on Long Hand Float  - 1 x daily - 7 x weekly - 3 sets - 10 reps - Plank on Long [employer] with Arm Lifts  - 1 x daily - 7 x weekly - 3 sets - 10 reps - Sitting Balance on Pool Noodle  - 1 x daily - 7 x weekly - 3 sets - 10 reps - Sit to Stand  - 1 x daily - 7 x weekly - 3 sets - 10 reps - Kick Board Row  - 1 x daily - 7 x weekly - 3 sets - 10 reps - Noodle press  - 1 x daily - 7 x weekly - 3 sets - 10 reps - Standing 'L' Stretch at [employer]  - 1 x daily - 7 x weekly - 3 sets - 10 reps - Squat  - 1 x daily - 7 x weekly - 3 sets -  10 reps - Standing Balance on Noodle at [employer]  - 1 x daily - 7 x weekly - 3 sets - 10 reps - Squat on Noodle  - 1 x daily - 7 x weekly - 3 sets - 10 reps   ASSESSMENT:  CLINICAL IMPRESSION: Repeated some of the same exercises as last session.  No increase in pain during session.  Plan to issue HEP for water next visit.  PT on land to update land HEP/ assess land based goals.  Pt voiced interest in d/c in next few visits, as she has had improvement of mobility and symptoms.  FOTO score improved to 53.       OBJECTIVE IMPAIRMENTS: decreased activity tolerance, difficulty walking, decreased strength, increased muscle spasms, improper body mechanics, postural dysfunction, and pain.   ACTIVITY LIMITATIONS: carrying, lifting, bending, sitting, standing, stairs, bed mobility, locomotion  level, and caring for others  PARTICIPATION LIMITATIONS: meal prep, cleaning, laundry, driving, shopping, community activity, occupation, and yard work  PERSONAL FACTORS: Past/current experiences and Time since onset of injury/illness/exacerbation are also affecting patient's functional outcome.   REHAB POTENTIAL: Good  CLINICAL DECISION MAKING: Evolving/moderate complexity  EVALUATION COMPLEXITY: Moderate   GOALS: Goals reviewed with patient? Yes  SHORT TERM GOALS: Target date: 5/10  Compliance with home exercise program as it has been established in the short-term Baseline: Goal status: MET  2.  Independent and utilization of self-correction techniques for pelvic rotation correction Baseline:  Goal status: INITIAL    LONG TERM GOALS: Target date: POC date  Will meet Foto goal Baseline:  Goal status:In progress 01/22/23  2.  Bilateral hip abduction strength within age-appropriate range Baseline: Not appropriate to test at evaluation due to notable innominate rotation indicating poor muscular stability Goal status: INITIAL  3.  Able to ride the bike without radicular symptoms Baseline:  Goal status: INITIAL  4.  Able to utilize stretches, exercises, and self-care techniques to complete functional daily activities without limitation by pain Baseline:  Goal status: INITIAL    PLAN:  PT FREQUENCY: 1-2x/week  PT DURATION: other: Through POC date  PLANNED INTERVENTIONS: Therapeutic exercises, Therapeutic activity, Neuromuscular re-education, Balance training, Gait training, Patient/Family education, Self Care, Joint mobilization, Stair training, Aquatic Therapy, Dry Needling, Electrical stimulation, Spinal mobilization, Cryotherapy, Moist heat, Taping, Ultrasound, Ionotophoresis 4mg /ml Dexamethasone, Manual therapy, and Re-evaluation.  PLAN FOR NEXT SESSION: Dry needling (land, as indicated), core activation, lumbar multifidi stabilization  Mayer Camel,  PTA 01/22/23 1:56 PM [facility] [address] [place], [place], [address] Phone: [phone]   Fax:  [phone]

## 2023-02-14 ENCOUNTER — Encounter (HOSPITAL_BASED_OUTPATIENT_CLINIC_OR_DEPARTMENT_OTHER): Payer: Self-pay | Admitting: Physical Therapy

## 2023-02-14 ENCOUNTER — Ambulatory Visit (HOSPITAL_BASED_OUTPATIENT_CLINIC_OR_DEPARTMENT_OTHER): Payer: BC Managed Care – PPO | Attending: Orthopedic Surgery | Admitting: Physical Therapy

## 2023-02-14 DIAGNOSIS — R293 Abnormal posture: Secondary | ICD-10-CM | POA: Diagnosis present

## 2023-02-14 DIAGNOSIS — M6281 Muscle weakness (generalized): Secondary | ICD-10-CM | POA: Diagnosis present

## 2023-02-14 DIAGNOSIS — R262 Difficulty in walking, not elsewhere classified: Secondary | ICD-10-CM | POA: Diagnosis present

## 2023-02-14 DIAGNOSIS — M5459 Other low back pain: Secondary | ICD-10-CM | POA: Insufficient documentation

## 2023-02-14 NOTE — Therapy (Signed)
OUTPATIENT PHYSICAL THERAPY TREATMENT   Patient Name: Mary Russell MRN: [id number] DOB:1976/05/05, 47 y.o., female Today's Date: 02/14/2023  END OF SESSION:  PT End of Session - 02/14/23 1000     Visit Number 12    Number of Visits 33    Date for PT Re-Evaluation 05/08/23    PT Start Time 1000    PT Stop Time 1044    PT Time Calculation (min) 44 min    Activity Tolerance Patient tolerated treatment well    Behavior During Therapy [facility] for tasks assessed/performed                 Past Medical History:  Diagnosis Date   Abdominal migraine    Anemia    Anxiety    Chronic migraine    Depression    Lumbar disc disease    Palpitations    Thyroid disease    Hoshimotos   Past Surgical History:  Procedure Laterality Date   CESAREAN SECTION  08/2004   06/2007   LUMBAR DISC SURGERY  03/2019   L5-S1   Patient Active Problem List   Diagnosis Date Noted   Thyroid disease    Palpitations    Abdominal migraine    Chronic migraine    Lumbar disc disease    Lumbar radiculopathy 04/30/2020     REFERRING PROVIDER:  Venita Lick, MD   REFERRING DIAG: M54.51 (ICD-10-CM) - Vertebrogenic low back pain   Rationale for Evaluation and Treatment: Rehabilitation  THERAPY DIAG:  Other low back pain  Difficulty in walking, not elsewhere classified  Muscle weakness (generalized)  Abnormal posture  ONSET DATE: approx 2020   SUBJECTIVE:                                                                                                                                                                                           SUBJECTIVE STATEMENT: Had a time with all the traveling and life activity that nerves were bad but overall my daily chronic pain is down tremendously.     PERTINENT HISTORY:  Abdominal migraine, history of L5-S1 lumbar surgery, history of C-section  PAIN:  Are you having pain? yes: NPRS scale: 1/10 Pain location: Thoracolumbar region Pain  description: Aching gets worse throughout the day Aggravating factors: Increase in pain over time Relieving factors: Lay supine  PRECAUTIONS: None  WEIGHT BEARING RESTRICTIONS: No  FALLS:  Has patient fallen in last 6 months? 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    OBJECTIVE:   DIAGNOSTIC FINDINGS:  No reports available at evaluation  PATIENT SURVEYS:  FOTO 47  01/22/23: 53;  goal of 57 COGNITIVE STATUS: Within functional limits for tasks assessed   RED FLAGS: None    SENSATION: WFL  POSTURE:  rounded shoulders, increased lumbar lordosis, and decreased thoracic kyphosis Lt PSIS elevation, in supine: Lt post innom rotation resulting in functional LLD (Rt longer)    Body Part #1 Lumbar   LE MMT LOWER EXTREMITY MMT: Not appropriate to test at evaluation due to notable pelvic rotation  MMT Right 5/20 Left 5/20 Rt/Lt 7/5 (lb)  Hip flexion 5/5 4-/5 37.2/44.1  Hip extension- in qped knee ext 5/5 3/5 20.8/9.9  Hip abduction 4+/5 4-/5 33.0/28.8  Hip adduction     Hip internal rotation     Hip external rotation     Knee flexion   21.1/19  Knee extension   36.9/29.3   (Blank rows = not tested)    ROM: Patient demonstrates range of motion within functional limits but is very guarded in forward flexion utilizing hands as a brace on her legs  TREATMENT:                                                                                                                             Treatment                            7/5:  Gym cable: lats, row, triceps Leg press machine -white Seated core on green physioball, added unwegithed UE motion Table planks with added hip ext   01/22/23 Pt seen for aquatic therapy today.  Treatment  took place in water 3.5-4.75 ft in depth at the [facility] pool. Temp of water was 91.  Pt entered/exited the pool independently via stairs using step through pattern with bilat hand rail.   * warm up of walking forward/backward ; light jog forward/ backward * side stepping with yellow hand float arm addct *Plank with hands on bench:  arm raises x 5; leg raises x5; bird dog x 10 *side to side pendulums: x 4 pausing to gain QL stretch;x 8 for core engagement * with yellow hand floats: plank position to/from superman * cycling in deeper water suspended by yellow hand floats under water at side x 2 min * marching forward/ backward with yellow hand floats at side * back against wall:  3 way LE stretch with ankle supported by blue noodle with hole  Treatment                            01/08/23: Pt seen for aquatic therapy today.  Treatment took place in water 3.5-4.75 ft in depth at the [facility] pool. Temp of water was 91.  Pt  entered/exited the pool independently via stairs using step through pattern with bilat hand rail.  * warm up of walking forward/backward  *Planks: holding position x 30 s x 2; arm raises x 5; leg raises x5 - -side plank.  VC and demonstration for proper execution holding x 30s; then with leg lifts *bird dog on bench x 10  - repeated on water mat x5 (surface too soft for proper execution);  paddle board x 5 R/L *side to side pendulums: x 4 pausing to gain QL stretch;x 8 for core engagement *superman position for stretch ue support using yellow HB; then upright fly x 5. * side stepping without/with arm addct with rainbow hand floats;    PATIENT EDUCATION:  Education details: exercise form/rationale Person educated: Patient Education method: Explanation, Demonstration, Tactile cues, Verbal cues, Education comprehension: verbalized understanding, returned demonstration, verbal cues required, tactile cues required, and needs further education  HOME  EXERCISE PROGRAM: Access Code: [email or web address] URL: [email or web address] Date: 12/25/2022 Prepared by: Geni Bers  Exercises - Side to Side Pendulum Swing with Foam Dumbbells and Ankle Floats  - 1 x daily - 7 x weekly - 3 sets - 10 reps - Forward Backward Pendulum Swings with Hip Abduction and Adduction  - 1 x daily - 7 x weekly - 3 sets - 10 reps - Plank on Long Hand Float  - 1 x daily - 7 x weekly - 3 sets - 10 reps - Plank on Long [employer] with Arm Lifts  - 1 x daily - 7 x weekly - 3 sets - 10 reps - Sitting Balance on Pool Noodle  - 1 x daily - 7 x weekly - 3 sets - 10 reps - Sit to Stand  - 1 x daily - 7 x weekly - 3 sets - 10 reps - Kick Board Row  - 1 x daily - 7 x weekly - 3 sets - 10 reps - Noodle press  - 1 x daily - 7 x weekly - 3 sets - 10 reps - Standing 'L' Stretch at [employer]  - 1 x daily - 7 x weekly - 3 sets - 10 reps - Squat  - 1 x daily - 7 x weekly - 3 sets - 10 reps - Standing Balance on Noodle at [employer]  - 1 x daily - 7 x weekly - 3 sets - 10 reps - Squat on Noodle  - 1 x daily - 7 x weekly - 3 sets - 10 reps   ASSESSMENT:  CLINICAL IMPRESSION: Worked in the gym on land-based strengthening activities and will continue at every other week. She will likely be beginning a new job at some point and will benefit from skilled PT to address changes in ADLs as well as appropriately progress gym program for long term strengthening, with reduced risk of reinjury.     OBJECTIVE IMPAIRMENTS: decreased activity tolerance, difficulty walking, decreased strength, increased muscle spasms, improper body mechanics, postural dysfunction, and pain.   ACTIVITY LIMITATIONS: carrying, lifting, bending, sitting, standing, stairs, bed mobility, locomotion level, and caring for others  PARTICIPATION LIMITATIONS: meal prep, cleaning, laundry, driving, shopping, community activity, occupation, and yard work  PERSONAL FACTORS: Past/current experiences and Time since onset  of injury/illness/exacerbation are also affecting patient's functional outcome.   REHAB POTENTIAL: Good  CLINICAL DECISION MAKING: Evolving/moderate complexity  EVALUATION COMPLEXITY: Moderate   GOALS: Goals reviewed with patient? Yes  SHORT TERM GOALS: Target date: 5/10  Compliance with home exercise program as it has  been established in the short-term Baseline: Goal status: MET  2.  Independent and utilization of self-correction techniques for pelvic rotation correction Baseline:  Goal status: INITIAL    LONG TERM GOALS: Target date: POC date  Will meet Foto goal Baseline:  Goal status:In progress 01/22/23  2.  Bilateral hip abduction strength within age-appropriate range Baseline: Not appropriate to test at evaluation due to notable innominate rotation indicating poor muscular stability Goal status: INITIAL  3.  Able to ride the bike without radicular symptoms Baseline:  Goal status: INITIAL  4.  Able to utilize stretches, exercises, and self-care techniques to complete functional daily activities without limitation by pain Baseline:  Goal status: INITIAL    PLAN:  PT FREQUENCY: 1-2x/week  PT DURATION: other: Through POC date  PLANNED INTERVENTIONS: Therapeutic exercises, Therapeutic activity, Neuromuscular re-education, Balance training, Gait training, Patient/Family education, Self Care, Joint mobilization, Stair training, Aquatic Therapy, Dry Needling, Electrical stimulation, Spinal mobilization, Cryotherapy, Moist heat, Taping, Ultrasound, Ionotophoresis 4mg /ml Dexamethasone, Manual therapy, and Re-evaluation.  PLAN FOR NEXT SESSION: Dry needling (land, as indicated), core activation, lumbar multifidi stabilization  Andyn Sales C. Sims Laday PT, DPT 02/14/23 10:48 AM  [facility] [address] [place], [place], [address] Phone: [phone]   Fax:  [phone]

## 2023-02-17 ENCOUNTER — Encounter (HOSPITAL_BASED_OUTPATIENT_CLINIC_OR_DEPARTMENT_OTHER): Payer: BC Managed Care – PPO

## 2023-02-19 ENCOUNTER — Ambulatory Visit (HOSPITAL_BASED_OUTPATIENT_CLINIC_OR_DEPARTMENT_OTHER): Payer: BC Managed Care – PPO | Admitting: Physical Therapy

## 2023-02-19 ENCOUNTER — Encounter (HOSPITAL_BASED_OUTPATIENT_CLINIC_OR_DEPARTMENT_OTHER): Payer: Self-pay | Admitting: Physical Therapy

## 2023-02-19 DIAGNOSIS — R293 Abnormal posture: Secondary | ICD-10-CM

## 2023-02-19 DIAGNOSIS — M5459 Other low back pain: Secondary | ICD-10-CM

## 2023-02-19 DIAGNOSIS — M6281 Muscle weakness (generalized): Secondary | ICD-10-CM

## 2023-02-19 DIAGNOSIS — R262 Difficulty in walking, not elsewhere classified: Secondary | ICD-10-CM

## 2023-02-19 NOTE — Therapy (Signed)
OUTPATIENT PHYSICAL THERAPY TREATMENT   Patient Name: Mary Russell MRN: [id number] DOB:09/20/1975, 47 y.o., female Today's Date: 02/19/2023  END OF SESSION:  PT End of Session - 02/19/23 0958     Visit Number 13    Number of Visits 33    Date for PT Re-Evaluation 05/08/23    PT Start Time 0948    PT Stop Time 1026    PT Time Calculation (min) 38 min    Behavior During Therapy [facility] for tasks assessed/performed                 Past Medical History:  Diagnosis Date   Abdominal migraine    Anemia    Anxiety    Chronic migraine    Depression    Lumbar disc disease    Palpitations    Thyroid disease    Hoshimotos   Past Surgical History:  Procedure Laterality Date   CESAREAN SECTION  08/2004   06/2007   LUMBAR DISC SURGERY  03/2019   L5-S1   Patient Active Problem List   Diagnosis Date Noted   Thyroid disease    Palpitations    Abdominal migraine    Chronic migraine    Lumbar disc disease    Lumbar radiculopathy 04/30/2020     REFERRING PROVIDER:  Venita Lick, MD   REFERRING DIAG: M54.51 (ICD-10-CM) - Vertebrogenic low back pain   Rationale for Evaluation and Treatment: Rehabilitation  THERAPY DIAG:  Other low back pain  Difficulty in walking, not elsewhere classified  Muscle weakness (generalized)  Abnormal posture  ONSET DATE: approx 2020   SUBJECTIVE:                                                                                                                                                                                           SUBJECTIVE STATEMENT: Pt reports she tweaked her back yesterday in water aerobics    PERTINENT HISTORY:  Abdominal migraine, history of L5-S1 lumbar surgery, history of C-section  PAIN:  Are you having pain? yes: NPRS scale: 3/10 Pain location: across low back (bilat) and Lt mid-thoracic Pain description: Aching gets worse throughout the day Aggravating factors: Increase in pain over  time Relieving factors: Lay supine  PRECAUTIONS: None  WEIGHT BEARING RESTRICTIONS: No  FALLS:  Has patient fallen in last 6 months? 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  OBJECTIVE:   DIAGNOSTIC FINDINGS:  No reports available at evaluation  PATIENT SURVEYS:  FOTO 47  01/22/23: 53;  goal of 57 COGNITIVE STATUS: Within functional limits for tasks assessed   RED FLAGS: None    SENSATION: WFL  POSTURE:  rounded shoulders, increased lumbar lordosis, and decreased thoracic kyphosis Lt PSIS elevation, in supine: Lt post innom rotation resulting in functional LLD (Rt longer)    Body Part #1 Lumbar   LE MMT LOWER EXTREMITY MMT: Not appropriate to test at evaluation due to notable pelvic rotation  MMT Right 5/20 Left 5/20 Rt/Lt 7/5 (lb)  Hip flexion 5/5 4-/5 37.2/44.1  Hip extension- in qped knee ext 5/5 3/5 20.8/9.9  Hip abduction 4+/5 4-/5 33.0/28.8  Hip adduction     Hip internal rotation     Hip external rotation     Knee flexion   21.1/19  Knee extension   36.9/29.3   (Blank rows = not tested)    ROM: Patient demonstrates range of motion within functional limits but is very guarded in forward flexion utilizing hands as a brace on her legs  TREATMENT:                                                                                                                             02/19/23 Pt seen for aquatic therapy today.  Treatment took place in water 3.5-4.75 ft in depth at the [facility] pool. Temp of water was 91.  Pt entered/exited the pool independently via stairs using step through pattern with bilat hand rail.   * walking backward/forward 1 lap in 4 ft * straddling noodle with gentle cycling 3 laps  with breast stroke arms  * plank with hip ext (hands on bench in water) x 10 each; then plank  to/from superman with yellow noodle and black barbell x 5 each * walking forward/backward with blue noodle under water at side (unilateral)- for increased core/shoulder girdle activation in gentle farmer carry  (tightened Lt low back when on L side) * side stepping with arm addct x 1 lap, repeated with rainbow hand floats * Low back stretch with hands holding rails, feet in 2nd ladder hole * back against wall:  3 way LE stretch with ankle supported by blue noodle with hole * standing balance on yellow noodle, then mini squats on noodle  Treatment                            7/5:  Gym cable: lats, row, triceps Leg press machine -white Seated core on green physioball, added unwegithed UE motion Table planks with added hip ext   01/22/23 Pt seen for aquatic therapy today.  Treatment took place in water 3.5-4.75 ft in depth at the [facility] pool. Temp of water was 91.  Pt entered/exited the pool independently via stairs using step through pattern with bilat hand rail.   * warm up  of walking forward/backward ; light jog forward/ backward * side stepping with yellow hand float arm addct *Plank with hands on bench:  arm raises x 5; leg raises x5; bird dog x 10 *side to side pendulums: x 4 pausing to gain QL stretch;x 8 for core engagement * with yellow hand floats: plank position to/from superman * cycling in deeper water suspended by yellow hand floats under water at side x 2 min * marching forward/ backward with yellow hand floats at side * back against wall:  3 way LE stretch with ankle supported by blue noodle with hole  Treatment                            01/08/23: Pt seen for aquatic therapy today.  Treatment took place in water 3.5-4.75 ft in depth at the [facility] pool. Temp of water was 91.  Pt entered/exited the pool independently via stairs using step through pattern  with bilat hand rail.  * warm up of walking forward/backward  *Planks: holding position x 30 s x 2; arm raises x 5; leg raises x5 - -side plank.  VC and demonstration for proper execution holding x 30s; then with leg lifts *bird dog on bench x 10  - repeated on water mat x5 (surface too soft for proper execution);  paddle board x 5 R/L *side to side pendulums: x 4 pausing to gain QL stretch;x 8 for core engagement *superman position for stretch ue support using yellow HB; then upright fly x 5. * side stepping without/with arm addct with rainbow hand floats;    PATIENT EDUCATION:  Education details: exercise form/rationale Person educated: Patient Education method: Explanation, Demonstration, Tactile cues, Verbal cues, Education comprehension: verbalized understanding, returned demonstration, verbal cues required, tactile cues required, and needs further education  HOME EXERCISE PROGRAM: Access Code: [email or web address] URL: [email or web address] Date: 12/25/2022 Prepared by: Geni Bers    ASSESSMENT:  CLINICAL IMPRESSION: Pt had slight flare up of symptoms during/after water aerobics class yesterday.  Reviewed safe exercises in water to perform when symptoms flare, and those to avoid.  Pt reported pain reduction to 0/10 during session.   She will likely be beginning a new job at some point and will benefit from skilled PT to address changes in ADLs as well as appropriately progress gym program for long term strengthening, with reduced risk of reinjury.     OBJECTIVE IMPAIRMENTS: decreased activity tolerance, difficulty walking, decreased strength, increased muscle spasms, improper body mechanics, postural dysfunction, and pain.   ACTIVITY LIMITATIONS: carrying, lifting, bending, sitting, standing, stairs, bed mobility, locomotion level, and caring for others  PARTICIPATION LIMITATIONS: meal prep, cleaning, laundry, driving, shopping, community activity, occupation, and  yard work  PERSONAL FACTORS: Past/current experiences and Time since onset of injury/illness/exacerbation are also affecting patient's functional outcome.   REHAB POTENTIAL: Good  CLINICAL DECISION MAKING: Evolving/moderate complexity  EVALUATION COMPLEXITY: Moderate   GOALS: Goals reviewed with patient? Yes  SHORT TERM GOALS: Target date: 5/10  Compliance with home exercise program as it has been established in the short-term Baseline: Goal status: MET  2.  Independent and utilization of self-correction techniques for pelvic rotation correction Baseline:  Goal status:In progress - 02/19/23    LONG TERM GOALS: Target date: POC date  Will meet Foto goal Baseline:  Goal status:In progress 01/22/23  2.  Bilateral hip abduction strength within age-appropriate range Baseline: Not appropriate to test at evaluation due to notable innominate rotation indicating  poor muscular stability Goal status: INITIAL  3.  Able to ride the bike without radicular symptoms Baseline: (hasn't attempted yet)  Goal status:In progress - 02/19/23  4.  Able to utilize stretches, exercises, and self-care techniques to complete functional daily activities without limitation by pain Baseline:  Goal statusIn progress -02/19/23    PLAN:  PT FREQUENCY: 1-2x/week  PT DURATION: other: Through POC date  PLANNED INTERVENTIONS: Therapeutic exercises, Therapeutic activity, Neuromuscular re-education, Balance training, Gait training, Patient/Family education, Self Care, Joint mobilization, Stair training, Aquatic Therapy, Dry Needling, Electrical stimulation, Spinal mobilization, Cryotherapy, Moist heat, Taping, Ultrasound, Ionotophoresis 4mg /ml Dexamethasone, Manual therapy, and Re-evaluation.  PLAN FOR NEXT SESSION: Dry needling (land, as indicated), core activation, lumbar multifidi stabilization  Mayer Camel, PTA 02/19/23 10:29 AM [facility] [address] [place], [place], [address] Phone: [phone]   Fax:  [phone]

## 2023-02-24 ENCOUNTER — Encounter (HOSPITAL_BASED_OUTPATIENT_CLINIC_OR_DEPARTMENT_OTHER): Payer: BC Managed Care – PPO

## 2023-02-26 ENCOUNTER — Ambulatory Visit (HOSPITAL_BASED_OUTPATIENT_CLINIC_OR_DEPARTMENT_OTHER): Payer: BC Managed Care – PPO | Admitting: Physical Therapy

## 2023-03-03 ENCOUNTER — Encounter (HOSPITAL_BASED_OUTPATIENT_CLINIC_OR_DEPARTMENT_OTHER): Payer: BC Managed Care – PPO

## 2023-03-05 ENCOUNTER — Ambulatory Visit (HOSPITAL_BASED_OUTPATIENT_CLINIC_OR_DEPARTMENT_OTHER): Payer: BC Managed Care – PPO | Admitting: Physical Therapy

## 2023-03-10 ENCOUNTER — Encounter (HOSPITAL_BASED_OUTPATIENT_CLINIC_OR_DEPARTMENT_OTHER): Payer: BC Managed Care – PPO

## 2023-03-12 ENCOUNTER — Ambulatory Visit (HOSPITAL_BASED_OUTPATIENT_CLINIC_OR_DEPARTMENT_OTHER): Payer: BC Managed Care – PPO | Admitting: Physical Therapy

## 2023-03-26 ENCOUNTER — Ambulatory Visit (HOSPITAL_BASED_OUTPATIENT_CLINIC_OR_DEPARTMENT_OTHER): Payer: BC Managed Care – PPO | Attending: Orthopedic Surgery | Admitting: Physical Therapy

## 2023-03-26 ENCOUNTER — Encounter (HOSPITAL_BASED_OUTPATIENT_CLINIC_OR_DEPARTMENT_OTHER): Payer: Self-pay | Admitting: Physical Therapy

## 2023-03-26 DIAGNOSIS — M6281 Muscle weakness (generalized): Secondary | ICD-10-CM | POA: Insufficient documentation

## 2023-03-26 DIAGNOSIS — R262 Difficulty in walking, not elsewhere classified: Secondary | ICD-10-CM | POA: Diagnosis present

## 2023-03-26 DIAGNOSIS — M5459 Other low back pain: Secondary | ICD-10-CM | POA: Diagnosis present

## 2023-03-26 DIAGNOSIS — R293 Abnormal posture: Secondary | ICD-10-CM | POA: Insufficient documentation

## 2023-03-26 NOTE — Therapy (Signed)
OUTPATIENT PHYSICAL THERAPY TREATMENT   Patient Name: Mary Russell MRN: [id number] DOB:[birth year], 47 y.o., female Today's Date: 03/26/2023  END OF SESSION:  PT End of Session - 03/26/23 1430     Visit Number 14    Number of Visits 33    Date for PT Re-Evaluation 05/08/23    PT Start Time 1430    PT Stop Time 1514    PT Time Calculation (min) 44 min    Activity Tolerance Patient tolerated treatment well    Behavior During Therapy [facility] for tasks assessed/performed                 Past Medical History:  Diagnosis Date   Abdominal migraine    Anemia    Anxiety    Chronic migraine    Depression    Lumbar disc disease    Palpitations    Thyroid disease    Hoshimotos   Past Surgical History:  Procedure Laterality Date   CESAREAN SECTION  08/2004   06/2007   LUMBAR DISC SURGERY  03/2019   L5-S1   Patient Active Problem List   Diagnosis Date Noted   Thyroid disease    Palpitations    Abdominal migraine    Chronic migraine    Lumbar disc disease    Lumbar radiculopathy 04/30/2020     REFERRING PROVIDER:  Venita Lick, MD   REFERRING DIAG: M54.51 (ICD-10-CM) - Vertebrogenic low back pain   Rationale for Evaluation and Treatment: Rehabilitation  THERAPY DIAG:  Other low back pain  Difficulty in walking, not elsewhere classified  Muscle weakness (generalized)  Abnormal posture  ONSET DATE: approx 2020   SUBJECTIVE:                                                                                                                                                                                           SUBJECTIVE STATEMENT: It took about 3 days to come back from the injury in water aerobics. Have been gone for a couple of weeks and have been really busy so my back is yelling at me.  When I do water aerobics it feels like the pain is left mid-spine and it is lower when I am walking around a lot.     PERTINENT HISTORY:  Abdominal migraine,  history of L5-S1 lumbar surgery, history of C-section  PAIN:  Are you having pain? yes: NPRS scale: 3/10 Pain location: across low back (bilat) and Lt mid-thoracic Pain description: Aching gets worse throughout the day Aggravating factors: Increase in pain over time Relieving factors: Lay supine  PRECAUTIONS: None  WEIGHT BEARING RESTRICTIONS: No  FALLS:  Has patient fallen in last 6 months? 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    OBJECTIVE:   DIAGNOSTIC FINDINGS:  No reports available at evaluation  PATIENT SURVEYS:  FOTO 47  01/22/23: 53;  goal of 57 COGNITIVE STATUS: Within functional limits for tasks assessed   RED FLAGS: None    SENSATION: WFL  POSTURE:  rounded shoulders, increased lumbar lordosis, and decreased thoracic kyphosis Lt PSIS elevation, in supine: Lt post innom rotation resulting in functional LLD (Rt longer)    Body Part #1 Lumbar   LE MMT LOWER EXTREMITY MMT: Not appropriate to test at evaluation due to notable pelvic rotation  MMT Right 5/20 Left 5/20 Rt/Lt 7/5 (lb) Rt/Lt 8/15  Hip flexion 5/5 4-/5 37.2/44.1   Hip extension- in qped knee ext 5/5 3/5 20.8/9.9 21.5/20.4  Hip abduction 4+/5 4-/5 33.0/28.8   Hip adduction      Hip internal rotation      Hip external rotation      Knee flexion   21.1/19   Knee extension   36.9/29.3 34.6/37.5   (Blank rows = not tested)    ROM: Patient demonstrates range of motion within functional limits but is very guarded in forward flexion utilizing hands as a brace on her legs  TREATMENT:                                                                                                                             Treatment                             8/15:  Manual STM bil thoracolumbar paraspinals & gluts; inf glide of Rt sacral upper quadrant & bil innominates; prone gross rib ER mobs Prone ab set Prone shoulder flexion with ab set Prone alt superman with cues for ab set Qped rocking Push ups from hands/knees with cues for core   02/19/23 Pt seen for aquatic therapy today.  Treatment took place in water 3.5-4.75 ft in depth at the [facility] pool. Temp of water was 91.  Pt entered/exited the pool independently via stairs using step through pattern with bilat hand rail.   * walking backward/forward 1 lap in 4 ft * straddling noodle with gentle cycling 3 laps with breast stroke arms  * plank with hip ext (hands on bench in water) x 10 each; then plank  to/from superman with yellow noodle and black barbell x 5 each * walking forward/backward with blue noodle under water at side (unilateral)- for increased core/shoulder girdle activation in gentle farmer carry  (tightened Lt low back when on L side) * side stepping with arm addct x 1 lap, repeated  with rainbow hand floats * Low back stretch with hands holding rails, feet in 2nd ladder hole * back against wall:  3 way LE stretch with ankle supported by blue noodle with hole * standing balance on yellow noodle, then mini squats on noodle  Treatment                            7/5:  Gym cable: lats, row, triceps Leg press machine -white Seated core on green physioball, added unwegithed UE motion Table planks with added hip ext   01/22/23 Pt seen for aquatic therapy today.  Treatment took place in water 3.5-4.75 ft in depth at the [facility] pool. Temp of water was 91.  Pt entered/exited the pool independently via stairs using step through pattern with bilat hand rail.   * warm up of walking forward/backward ; light jog forward/ backward * side stepping with yellow hand float arm addct *Plank with hands on bench:  arm raises x 5; leg raises x5; bird dog x 10 *side  to side pendulums: x 4 pausing to gain QL stretch;x 8 for core engagement * with yellow hand floats: plank position to/from superman * cycling in deeper water suspended by yellow hand floats under water at side x 2 min * marching forward/ backward with yellow hand floats at side * back against wall:  3 way LE stretch with ankle supported by blue noodle with hole    PATIENT EDUCATION:  Education details: exercise form/rationale Person educated: Patient Education method: Explanation, Demonstration, Tactile cues, Verbal cues, Education comprehension: verbalized understanding, returned demonstration, verbal cues required, tactile cues required, and needs further education  HOME EXERCISE PROGRAM: Access Code: [email or web address] URL: [email or web address] Date: 12/25/2022 Prepared by: Geni Bers    ASSESSMENT:  CLINICAL IMPRESSION: Able to reduce concordant pain with STM to lumbar paraspinals and gluts and improve core activation. Cont to tend toward and anterior pelvic tilt in a seated posture that she will work on. Added an aquatic appt tomorrow to work in the pool while she has decreased tension and activate core.     OBJECTIVE IMPAIRMENTS: decreased activity tolerance, difficulty walking, decreased strength, increased muscle spasms, improper body mechanics, postural dysfunction, and pain.   ACTIVITY LIMITATIONS: carrying, lifting, bending, sitting, standing, stairs, bed mobility, locomotion level, and caring for others  PARTICIPATION LIMITATIONS: meal prep, cleaning, laundry, driving, shopping, community activity, occupation, and yard work  PERSONAL FACTORS: Past/current experiences and Time since onset of injury/illness/exacerbation are also affecting patient's functional outcome.   REHAB POTENTIAL: Good  CLINICAL DECISION MAKING: Evolving/moderate complexity  EVALUATION COMPLEXITY: Moderate   GOALS: Goals reviewed with patient? Yes  SHORT TERM GOALS: Target  date: 5/10  Compliance with home exercise program as it has been established in the short-term Baseline: Goal status: MET  2.  Independent and utilization of self-correction techniques for pelvic rotation correction Baseline:  Goal status:In progress - 02/19/23    LONG TERM GOALS: Target date: POC date  Will meet Foto goal Baseline:  Goal status:In progress 01/22/23  2.  Bilateral hip abduction strength within age-appropriate range Baseline: Not appropriate to test at evaluation due to notable innominate rotation indicating poor muscular stability Goal status: INITIAL  3.  Able to ride the bike without radicular symptoms Baseline: (hasn't attempted yet) xride has been fine so far on 8/15 Goal status:In progress - 02/19/23  4.  Able to utilize stretches, exercises, and self-care techniques to complete functional daily activities without  limitation by pain Baseline:  Goal statusIn progress -02/19/23    PLAN:  PT FREQUENCY: 1-2x/week  PT DURATION: other: Through POC date  PLANNED INTERVENTIONS: Therapeutic exercises, Therapeutic activity, Neuromuscular re-education, Balance training, Gait training, Patient/Family education, Self Care, Joint mobilization, Stair training, Aquatic Therapy, Dry Needling, Electrical stimulation, Spinal mobilization, Cryotherapy, Moist heat, Taping, Ultrasound, Ionotophoresis 4mg /ml Dexamethasone, Manual therapy, and Re-evaluation.  PLAN FOR NEXT SESSION: Dry needling (land, as indicated), core activation, lumbar multifidi stabilization  Ellery Tash C. Jarel Cuadra PT, DPT 03/26/23 3:19 PM  [facility] [address] [place], [place], [address] Phone: [phone]   Fax:  [phone]

## 2023-03-27 ENCOUNTER — Ambulatory Visit (HOSPITAL_BASED_OUTPATIENT_CLINIC_OR_DEPARTMENT_OTHER): Payer: BC Managed Care – PPO | Admitting: Physical Therapy

## 2023-03-27 ENCOUNTER — Encounter (HOSPITAL_BASED_OUTPATIENT_CLINIC_OR_DEPARTMENT_OTHER): Payer: Self-pay | Admitting: Physical Therapy

## 2023-03-27 DIAGNOSIS — M5459 Other low back pain: Secondary | ICD-10-CM | POA: Diagnosis not present

## 2023-03-27 DIAGNOSIS — M6281 Muscle weakness (generalized): Secondary | ICD-10-CM

## 2023-03-27 DIAGNOSIS — R262 Difficulty in walking, not elsewhere classified: Secondary | ICD-10-CM

## 2023-03-27 NOTE — Therapy (Signed)
OUTPATIENT PHYSICAL THERAPY TREATMENT   Patient Name: Mary Russell MRN: [id number] DOB:[birth year], 47 y.o., female Today's Date: 03/27/2023  END OF SESSION:  PT End of Session - 03/27/23 1255     Visit Number 15    Number of Visits 33    Date for PT Re-Evaluation 05/08/23    PT Start Time 0817    PT Stop Time 0900    PT Time Calculation (min) 43 min    Activity Tolerance Patient tolerated treatment well    Behavior During Therapy [facility] for tasks assessed/performed                  Past Medical History:  Diagnosis Date   Abdominal migraine    Anemia    Anxiety    Chronic migraine    Depression    Lumbar disc disease    Palpitations    Thyroid disease    Hoshimotos   Past Surgical History:  Procedure Laterality Date   CESAREAN SECTION  08/2004   06/2007   LUMBAR DISC SURGERY  03/2019   L5-S1   Patient Active Problem List   Diagnosis Date Noted   Thyroid disease    Palpitations    Abdominal migraine    Chronic migraine    Lumbar disc disease    Lumbar radiculopathy 04/30/2020     REFERRING PROVIDER:  Venita Lick, MD   REFERRING DIAG: M54.51 (ICD-10-CM) - Vertebrogenic low back pain   Rationale for Evaluation and Treatment: Rehabilitation  THERAPY DIAG:  Other low back pain  Difficulty in walking, not elsewhere classified  Muscle weakness (generalized)  ONSET DATE: approx 2020   SUBJECTIVE:                                                                                                                                                                                           SUBJECTIVE STATEMENT: Pain is about 3/10.  I am going away this weekend and am worried about my pain level increasing.  Think I should just stretch and work on core strength today.    PERTINENT HISTORY:  Abdominal migraine, history of L5-S1 lumbar surgery, history of C-section  PAIN:  Are you having pain? yes: NPRS scale: 3/10 Pain location: across low back  (bilat) and Lt mid-thoracic Pain description: Aching gets worse throughout the day Aggravating factors: Increase in pain over time Relieving factors: Lay supine  PRECAUTIONS: None  WEIGHT BEARING RESTRICTIONS: No  FALLS:  Has patient fallen in last 6 months? 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    OBJECTIVE:   DIAGNOSTIC FINDINGS:  No reports available at evaluation  PATIENT SURVEYS:  FOTO 47  01/22/23: 53;  goal of 57 COGNITIVE STATUS: Within functional limits for tasks assessed   RED FLAGS: None    SENSATION: WFL  POSTURE:  rounded shoulders, increased lumbar lordosis, and decreased thoracic kyphosis Lt PSIS elevation, in supine: Lt post innom rotation resulting in functional LLD (Rt longer)    Body Part #1 Lumbar   LE MMT LOWER EXTREMITY MMT: Not appropriate to test at evaluation due to notable pelvic rotation  MMT Right 5/20 Left 5/20 Rt/Lt 7/5 (lb) Rt/Lt 8/15  Hip flexion 5/5 4-/5 37.2/44.1   Hip extension- in qped knee ext 5/5 3/5 20.8/9.9 21.5/20.4  Hip abduction 4+/5 4-/5 33.0/28.8   Hip adduction      Hip internal rotation      Hip external rotation      Knee flexion   21.1/19   Knee extension   36.9/29.3 34.6/37.5   (Blank rows = not tested)    ROM: Patient demonstrates range of motion within functional limits but is very guarded in forward flexion utilizing hands as a brace on her legs  TREATMENT:                                                                                                                             03/27/23 Pt seen for aquatic therapy today.  Treatment took place in water 3.5-4.75 ft in depth at the [facility] pool. Temp of water was 91.  Pt entered/exited the pool  independently via stairs using step through pattern with bilat hand rail.   * walking backward/forward 1 lap in 4 ft *L stretch; L stretch with tail wagging *lumbopelvic stretching: posterior pelvic tilting  to neutral then hip hiking seated on solid noodle *Standing 3 way stretch using solid noodle x2 in each position R/L *Farmers carry using yellow HB. Cues for engaged core *cycling on noodle    Treatment                            8/15:  Manual STM bil thoracolumbar paraspinals & gluts; inf glide of Rt sacral upper quadrant & bil innominates; prone gross rib ER mobs Prone ab set Prone shoulder flexion with ab set Prone alt superman with cues for ab set Qped rocking Push ups from hands/knees with cues for core   02/19/23 Pt seen for aquatic therapy today.  Treatment took place in water 3.5-4.75 ft in depth at the [facility] pool. Temp of water was 91.  Pt entered/exited the pool independently via stairs using step through pattern with bilat hand rail.   * walking backward/forward 1 lap in 4 ft * straddling noodle with gentle cycling 3 laps  with breast stroke arms  * plank with hip ext (hands on bench in water) x 10 each; then plank  to/from superman with yellow noodle and black barbell x 5 each * walking forward/backward with blue noodle under water at side (unilateral)- for increased core/shoulder girdle activation in gentle farmer carry  (tightened Lt low back when on L side) * side stepping with arm addct x 1 lap, repeated with rainbow hand floats * Low back stretch with hands holding rails, feet in 2nd ladder hole * back against wall:  3 way LE stretch with ankle supported by blue noodle with hole * standing balance on yellow noodle, then mini squats on noodle  Treatment                            7/5:  Gym cable: lats, row, triceps Leg press machine -white Seated core on green physioball, added unwegithed UE motion Table planks with added hip  ext   01/22/23 Pt seen for aquatic therapy today.  Treatment took place in water 3.5-4.75 ft in depth at the [facility] pool. Temp of water was 91.  Pt entered/exited the pool independently via stairs using step through pattern with bilat hand rail.   * warm up of walking forward/backward ; light jog forward/ backward * side stepping with yellow hand float arm addct *Plank with hands on bench:  arm raises x 5; leg raises x5; bird dog x 10 *side to side pendulums: x 4 pausing to gain QL stretch;x 8 for core engagement * with yellow hand floats: plank position to/from superman * cycling in deeper water suspended by yellow hand floats under water at side x 2 min * marching forward/ backward with yellow hand floats at side * back against wall:  3 way LE stretch with ankle supported by blue noodle with hole    PATIENT EDUCATION:  Education details: exercise form/rationale Person educated: Patient Education method: Explanation, Demonstration, Tactile cues, Verbal cues, Education comprehension: verbalized understanding, returned demonstration, verbal cues required, tactile cues required, and needs further education  HOME EXERCISE PROGRAM: Access Code: [email or web address] URL: [email or web address] Date: 12/25/2022 Prepared by: Geni Bers    ASSESSMENT:  CLINICAL IMPRESSION: Focused on decreasing tension and tightness while activating posterior core.  She does have some apprehension/guarding with movement due to recent flare of LBP with pending short vacation over weekend.  Pt education on Aleve/Advil vs tylenolas pain relievers.  She VU. Reports good response from last land based therapy with some reduction in pain.     OBJECTIVE IMPAIRMENTS: decreased activity tolerance, difficulty walking, decreased strength, increased muscle spasms, improper body mechanics, postural dysfunction, and pain.   ACTIVITY LIMITATIONS: carrying, lifting, bending, sitting, standing,  stairs, bed mobility, locomotion level, and caring for others  PARTICIPATION LIMITATIONS: meal prep, cleaning, laundry, driving, shopping, community activity, occupation, and yard work  PERSONAL FACTORS: Past/current experiences and Time since onset of injury/illness/exacerbation are also affecting patient's functional outcome.   REHAB POTENTIAL: Good  CLINICAL DECISION MAKING: Evolving/moderate complexity  EVALUATION COMPLEXITY: Moderate   GOALS: Goals reviewed with patient? Yes  SHORT TERM GOALS: Target date: 5/10  Compliance with home exercise program as it has been established in the short-term Baseline: Goal status: MET  2.  Independent and utilization of self-correction techniques for pelvic rotation correction Baseline:  Goal status:In progress - 02/19/23    LONG TERM GOALS: Target date: POC date  Will meet Foto goal Baseline:  Goal status:In progress  01/22/23  2.  Bilateral hip abduction strength within age-appropriate range Baseline: Not appropriate to test at evaluation due to notable innominate rotation indicating poor muscular stability Goal status: INITIAL  3.  Able to ride the bike without radicular symptoms Baseline: (hasn't attempted yet) xride has been fine so far on 8/15 Goal status:In progress - 02/19/23  4.  Able to utilize stretches, exercises, and self-care techniques to complete functional daily activities without limitation by pain Baseline:  Goal statusIn progress -02/19/23    PLAN:  PT FREQUENCY: 1-2x/week  PT DURATION: other: Through POC date  PLANNED INTERVENTIONS: Therapeutic exercises, Therapeutic activity, Neuromuscular re-education, Balance training, Gait training, Patient/Family education, Self Care, Joint mobilization, Stair training, Aquatic Therapy, Dry Needling, Electrical stimulation, Spinal mobilization, Cryotherapy, Moist heat, Taping, Ultrasound, Ionotophoresis 4mg /ml Dexamethasone, Manual therapy, and Re-evaluation.  PLAN  FOR NEXT SESSION: Dry needling (land, as indicated), core activation, lumbar multifidi stabilization  Rushie Chestnut) Mary Russell MPT 03/27/23 12:56 PM  [facility] [address] [place], [place], [address] Phone: [phone]   Fax:  [phone]

## 2023-04-09 ENCOUNTER — Encounter (HOSPITAL_BASED_OUTPATIENT_CLINIC_OR_DEPARTMENT_OTHER): Payer: Self-pay | Admitting: Physical Therapy

## 2023-04-09 ENCOUNTER — Ambulatory Visit (HOSPITAL_BASED_OUTPATIENT_CLINIC_OR_DEPARTMENT_OTHER): Payer: BC Managed Care – PPO | Admitting: Physical Therapy

## 2023-04-09 DIAGNOSIS — M5459 Other low back pain: Secondary | ICD-10-CM | POA: Diagnosis not present

## 2023-04-09 DIAGNOSIS — M6281 Muscle weakness (generalized): Secondary | ICD-10-CM

## 2023-04-09 DIAGNOSIS — R262 Difficulty in walking, not elsewhere classified: Secondary | ICD-10-CM

## 2023-04-09 DIAGNOSIS — R293 Abnormal posture: Secondary | ICD-10-CM

## 2023-04-09 NOTE — Therapy (Signed)
OUTPATIENT PHYSICAL THERAPY TREATMENT   Patient Name: Mary Russell MRN: [id number] DOB:1976-03-17, 47 y.o., female Today's Date: 04/09/2023  END OF SESSION:  PT End of Session - 04/09/23 1308     Visit Number 16    Number of Visits 33    Date for PT Re-Evaluation 05/08/23    PT Start Time 1306    PT Stop Time 1341    PT Time Calculation (min) 35 min    Activity Tolerance Patient tolerated treatment well    Behavior During Therapy [facility] for tasks assessed/performed                  Past Medical History:  Diagnosis Date   Abdominal migraine    Anemia    Anxiety    Chronic migraine    Depression    Lumbar disc disease    Palpitations    Thyroid disease    Hoshimotos   Past Surgical History:  Procedure Laterality Date   CESAREAN SECTION  08/2004   06/2007   LUMBAR DISC SURGERY  03/2019   L5-S1   Patient Active Problem List   Diagnosis Date Noted   Thyroid disease    Palpitations    Abdominal migraine    Chronic migraine    Lumbar disc disease    Lumbar radiculopathy 04/30/2020     REFERRING PROVIDER:  Venita Lick, MD   REFERRING DIAG: M54.51 (ICD-10-CM) - Vertebrogenic low back pain   Rationale for Evaluation and Treatment: Rehabilitation  THERAPY DIAG:  Other low back pain  Difficulty in walking, not elsewhere classified  Muscle weakness (generalized)  Abnormal posture  ONSET DATE: approx 2020   SUBJECTIVE:                                                                                                                                                                                           SUBJECTIVE STATEMENT: Was getting really loose with aqua & stretches but did a lot of yard work and then sat down and felt a sharp pain and have been resting, icing and aleve.     PERTINENT HISTORY:  Abdominal migraine, history of L5-S1 lumbar surgery, history of C-section  PAIN:  Are you having pain? yes: NPRS scale: 3/10 Pain location:  across low back (bilat) and Lt mid-thoracic Pain description: Aching gets worse throughout the day Aggravating factors: Increase in pain over time Relieving factors: Lay supine  PRECAUTIONS: None  WEIGHT BEARING RESTRICTIONS: No  FALLS:  Has patient fallen in last 6 months? 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    OBJECTIVE:   DIAGNOSTIC FINDINGS:  No reports available at evaluation  PATIENT SURVEYS:  FOTO 47  01/22/23: 53;  goal of 57 COGNITIVE STATUS: Within functional limits for tasks assessed   RED FLAGS: None    SENSATION: WFL  POSTURE:  rounded shoulders, increased lumbar lordosis, and decreased thoracic kyphosis Lt PSIS elevation, in supine: Lt post innom rotation resulting in functional LLD (Rt longer)    Body Part #1 Lumbar   LE MMT LOWER EXTREMITY MMT: Not appropriate to test at evaluation due to notable pelvic rotation  MMT Right 5/20 Left 5/20 Rt/Lt 7/5 (lb) Rt/Lt 8/15  Hip flexion 5/5 4-/5 37.2/44.1   Hip extension- in qped knee ext 5/5 3/5 20.8/9.9 21.5/20.4  Hip abduction 4+/5 4-/5 33.0/28.8   Hip adduction      Hip internal rotation      Hip external rotation      Knee flexion   21.1/19   Knee extension   36.9/29.3 34.6/37.5   (Blank rows = not tested)    ROM: Patient demonstrates range of motion within functional limits but is very guarded in forward flexion utilizing hands as a brace on her legs  TREATMENT:                                                                                                                             Treatment                            8/29:  Trigger Point Dry Needling, Manual Therapy Treatment:  Initial or subsequent education regarding Trigger Point Dry  Needling: Subsequent Did patient give consent to treatment with Trigger Point Dry Needling: Yes TPDN with skilled palpation and monitoring followed by STM to the following muscles: L4-5 lumbar paraspinals bil, bilat glut med & min  Child pose 3 deep breaths Hooklying post pelvic tilt Supine HS & ITB stretch with strap Qped: pelvic tilt, bird dog Primal push up   03/27/23 Pt seen for aquatic therapy today.  Treatment took place in water 3.5-4.75 ft in depth at the [facility] pool. Temp of water was 91.  Pt entered/exited the pool independently via stairs using step through pattern with bilat hand rail.   * walking backward/forward 1 lap in 4 ft *L stretch; L stretch with tail wagging *lumbopelvic stretching: posterior pelvic tilting  to neutral then hip hiking seated on solid noodle *Standing 3 way stretch using solid noodle x2 in each position R/L *Farmers carry using yellow HB. Cues for engaged core *cycling on noodle    Treatment                            8/15:  Manual STM bil thoracolumbar paraspinals & gluts; inf glide of Rt sacral upper quadrant & bil innominates; prone gross rib ER mobs Prone ab set Prone shoulder flexion with ab set Prone alt superman with cues for ab set Qped rocking Push ups from hands/knees with cues for core     PATIENT EDUCATION:  Education details: exercise form/rationale Person educated: Patient Education method: Explanation, Demonstration, Tactile cues, Verbal cues, Education comprehension: verbalized understanding, returned demonstration, verbal cues required, tactile cues required, and needs further education  HOME EXERCISE PROGRAM: Access Code: [email or web address] URL: [email or web address]     ASSESSMENT:  CLINICAL IMPRESSION: Able to reduce migraine with DN to lumbar spine today. Focused on core activation and post chain lengthening today.     OBJECTIVE IMPAIRMENTS: decreased activity tolerance, difficulty  walking, decreased strength, increased muscle spasms, improper body mechanics, postural dysfunction, and pain.   ACTIVITY LIMITATIONS: carrying, lifting, bending, sitting, standing, stairs, bed mobility, locomotion level, and caring for others  PARTICIPATION LIMITATIONS: meal prep, cleaning, laundry, driving, shopping, community activity, occupation, and yard work  PERSONAL FACTORS: Past/current experiences and Time since onset of injury/illness/exacerbation are also affecting patient's functional outcome.   REHAB POTENTIAL: Good  CLINICAL DECISION MAKING: Evolving/moderate complexity  EVALUATION COMPLEXITY: Moderate   GOALS: Goals reviewed with patient? Yes  SHORT TERM GOALS: Target date: 5/10  Compliance with home exercise program as it has been established in the short-term Baseline: Goal status: MET  2.  Independent and utilization of self-correction techniques for pelvic rotation correction Baseline:  Goal status:In progress - 02/19/23    LONG TERM GOALS: Target date: POC date  Will meet Foto goal Baseline:  Goal status:In progress 01/22/23  2.  Bilateral hip abduction strength within age-appropriate range Baseline: Not appropriate to test at evaluation due to notable innominate rotation indicating poor muscular stability Goal status: INITIAL  3.  Able to ride the bike without radicular symptoms Baseline: (hasn't attempted yet) xride has been fine so far on 8/15 Goal status:In progress - 02/19/23  4.  Able to utilize stretches, exercises, and self-care techniques to complete functional daily activities without limitation by pain Baseline:  Goal statusIn progress -02/19/23    PLAN:  PT FREQUENCY: 1-2x/week  PT DURATION: other: Through POC date  PLANNED INTERVENTIONS: Therapeutic exercises, Therapeutic activity, Neuromuscular re-education, Balance training, Gait training, Patient/Family education, Self Care, Joint mobilization, Stair training, Aquatic Therapy,  Dry Needling, Electrical stimulation, Spinal mobilization, Cryotherapy, Moist heat, Taping, Ultrasound, Ionotophoresis 4mg /ml Dexamethasone, Manual therapy, and Re-evaluation.  PLAN FOR NEXT SESSION: Dry needling (land, as indicated), core activation, lumbar multifidi stabilization  Mavis Gravelle C. Carlissa Pesola PT, DPT 04/09/23 1:41 PM   [facility] [address] [place], [place], [address] Phone: [phone]   Fax:  [phone]

## 2023-04-21 ENCOUNTER — Encounter (HOSPITAL_BASED_OUTPATIENT_CLINIC_OR_DEPARTMENT_OTHER): Payer: Self-pay | Admitting: Physical Therapy

## 2023-04-21 ENCOUNTER — Ambulatory Visit (HOSPITAL_BASED_OUTPATIENT_CLINIC_OR_DEPARTMENT_OTHER): Payer: BC Managed Care – PPO | Attending: Orthopedic Surgery | Admitting: Physical Therapy

## 2023-04-21 DIAGNOSIS — R262 Difficulty in walking, not elsewhere classified: Secondary | ICD-10-CM | POA: Insufficient documentation

## 2023-04-21 DIAGNOSIS — M6281 Muscle weakness (generalized): Secondary | ICD-10-CM | POA: Diagnosis present

## 2023-04-21 DIAGNOSIS — M5459 Other low back pain: Secondary | ICD-10-CM | POA: Insufficient documentation

## 2023-04-21 NOTE — Therapy (Signed)
OUTPATIENT PHYSICAL THERAPY TREATMENT   Patient Name: Mary Russell MRN: [id number] DOB:09/23/1975, 47 y.o., female Today's Date: 04/21/2023  END OF SESSION:  PT End of Session - 04/21/23 1101     Visit Number 17    Number of Visits 33    Date for PT Re-Evaluation 05/08/23    PT Start Time 1100    PT Stop Time 1143    PT Time Calculation (min) 43 min    Activity Tolerance Patient tolerated treatment well    Behavior During Therapy [facility] for tasks assessed/performed                   Past Medical History:  Diagnosis Date   Abdominal migraine    Anemia    Anxiety    Chronic migraine    Depression    Lumbar disc disease    Palpitations    Thyroid disease    Hoshimotos   Past Surgical History:  Procedure Laterality Date   CESAREAN SECTION  08/2004   06/2007   LUMBAR DISC SURGERY  03/2019   L5-S1   Patient Active Problem List   Diagnosis Date Noted   Thyroid disease    Palpitations    Abdominal migraine    Chronic migraine    Lumbar disc disease    Lumbar radiculopathy 04/30/2020     REFERRING PROVIDER:  Venita Lick, MD   REFERRING DIAG: M54.51 (ICD-10-CM) - Vertebrogenic low back pain   Rationale for Evaluation and Treatment: Rehabilitation  THERAPY DIAG:  Other low back pain  Difficulty in walking, not elsewhere classified  Muscle weakness (generalized)  ONSET DATE: approx 2020   SUBJECTIVE:                                                                                                                                                                                           SUBJECTIVE STATEMENT: Actually feeling it more in my upper back. Otherwise feeling better than I have been. A lot of cooking recently.     PERTINENT HISTORY:  Abdominal migraine, history of L5-S1 lumbar surgery, history of C-section  PAIN:  Are you having pain? yes: NPRS scale: 3/10 Pain location: across low back (bilat) and Lt mid-thoracic Pain  description: Aching gets worse throughout the day Aggravating factors: Increase in pain over time Relieving factors: Lay supine  PRECAUTIONS: None  WEIGHT BEARING RESTRICTIONS: No  FALLS:  Has patient fallen in last 6 months? 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    OBJECTIVE:   DIAGNOSTIC FINDINGS:  No reports available at evaluation  PATIENT SURVEYS:  FOTO 47  01/22/23: 53;  goal of 57 COGNITIVE STATUS: Within functional limits for tasks assessed   RED FLAGS: None    SENSATION: WFL  POSTURE:  rounded shoulders, increased lumbar lordosis, and decreased thoracic kyphosis Lt PSIS elevation, in supine: Lt post innom rotation resulting in functional LLD (Rt longer)    Body Part #1 Lumbar   LE MMT LOWER EXTREMITY MMT: Not appropriate to test at evaluation due to notable pelvic rotation  MMT Right 5/20 Left 5/20 Rt/Lt 7/5 (lb) Rt/Lt 8/15  Hip flexion 5/5 4-/5 37.2/44.1   Hip extension- in qped knee ext 5/5 3/5 20.8/9.9 21.5/20.4  Hip abduction 4+/5 4-/5 33.0/28.8   Hip adduction      Hip internal rotation      Hip external rotation      Knee flexion   21.1/19   Knee extension   36.9/29.3 34.6/37.5   (Blank rows = not tested)    ROM: Patient demonstrates range of motion within functional limits but is very guarded in forward flexion utilizing hands as a brace on her legs  TREATMENT:                                                                                                                             Treatment                            9/10:  Manual prone rib/thoracic mobs- cavitations noted with grade 4 mobilizations Prone swimmer Qped on elbows- hip ext with pelvic tilt; hip ext- tap edge of table &  lift Sidelying hip burner series: hip abd, circles, arcs Downward  dog Yoga pose modifications   Treatment                            8/29:  Trigger Point Dry Needling, Manual Therapy Treatment:  Initial or subsequent education regarding Trigger Point Dry Needling: Subsequent Did patient give consent to treatment with Trigger Point Dry Needling: Yes TPDN with skilled palpation and monitoring followed by STM to the following muscles: L4-5 lumbar paraspinals bil, bilat glut med & min  Child pose 3 deep breaths Hooklying post pelvic tilt Supine HS & ITB stretch with strap Qped: pelvic tilt, bird dog Primal push up   03/27/23 Pt seen for aquatic therapy today.  Treatment took place in water 3.5-4.75 ft in depth at the [facility] pool. Temp of water was 91.  Pt entered/exited the pool independently via stairs using step through pattern with bilat hand rail.   * walking backward/forward 1 lap in 4 ft *L stretch; L stretch with tail wagging *lumbopelvic stretching: posterior pelvic tilting  to neutral then hip  hiking seated on solid noodle *Standing 3 way stretch using solid noodle x2 in each position R/L *Farmers carry using yellow HB. Cues for engaged core *cycling on noodle      PATIENT EDUCATION:  Education details: exercise form/rationale Person educated: Patient Education method: Explanation, Demonstration, Tactile cues, Verbal cues, Education comprehension: verbalized understanding, returned demonstration, verbal cues required, tactile cues required, and needs further education  HOME EXERCISE PROGRAM: Access Code: [email or web address] URL: [email or web address]     ASSESSMENT:  CLINICAL IMPRESSION: Stiffness through rib cage and thoracic spine consistent with symptoms. Plans to continue with gradual return to yoga and be careful.     OBJECTIVE IMPAIRMENTS: decreased activity tolerance, difficulty walking, decreased strength, increased muscle spasms,  improper body mechanics, postural dysfunction, and pain.   ACTIVITY LIMITATIONS: carrying, lifting, bending, sitting, standing, stairs, bed mobility, locomotion level, and caring for others  PARTICIPATION LIMITATIONS: meal prep, cleaning, laundry, driving, shopping, community activity, occupation, and yard work  PERSONAL FACTORS: Past/current experiences and Time since onset of injury/illness/exacerbation are also affecting patient's functional outcome.   REHAB POTENTIAL: Good  CLINICAL DECISION MAKING: Evolving/moderate complexity  EVALUATION COMPLEXITY: Moderate   GOALS: Goals reviewed with patient? Yes  SHORT TERM GOALS: Target date: 5/10  Compliance with home exercise program as it has been established in the short-term Baseline: Goal status: MET  2.  Independent and utilization of self-correction techniques for pelvic rotation correction Baseline:  Goal status:In progress - 02/19/23    LONG TERM GOALS: Target date: POC date  Will meet Foto goal Baseline:  Goal status:In progress 01/22/23  2.  Bilateral hip abduction strength within age-appropriate range Baseline: Not appropriate to test at evaluation due to notable innominate rotation indicating poor muscular stability Goal status: INITIAL  3.  Able to ride the bike without radicular symptoms Baseline: (hasn't attempted yet) xride has been fine so far on 8/15 Goal status:In progress - 02/19/23  4.  Able to utilize stretches, exercises, and self-care techniques to complete functional daily activities without limitation by pain Baseline:  Goal statusIn progress -02/19/23    PLAN:  PT FREQUENCY: 1-2x/week  PT DURATION: other: Through POC date  PLANNED INTERVENTIONS: Therapeutic exercises, Therapeutic activity, Neuromuscular re-education, Balance training, Gait training, Patient/Family education, Self Care, Joint mobilization, Stair training, Aquatic Therapy, Dry Needling, Electrical stimulation, Spinal  mobilization, Cryotherapy, Moist heat, Taping, Ultrasound, Ionotophoresis 4mg /ml Dexamethasone, Manual therapy, and Re-evaluation.  PLAN FOR NEXT SESSION: need to review any other yoga poses? D/c if doing well  Tashon Capp C. Shantoria Ellwood PT, DPT 04/21/23 11:43 AM   [facility] [address] [place], [place], [address] Phone: [phone]   Fax:  [phone]

## 2023-05-07 ENCOUNTER — Encounter (HOSPITAL_BASED_OUTPATIENT_CLINIC_OR_DEPARTMENT_OTHER): Payer: Self-pay | Admitting: Physical Therapy

## 2023-05-07 ENCOUNTER — Ambulatory Visit (HOSPITAL_BASED_OUTPATIENT_CLINIC_OR_DEPARTMENT_OTHER): Payer: BC Managed Care – PPO | Admitting: Physical Therapy

## 2023-05-07 DIAGNOSIS — R262 Difficulty in walking, not elsewhere classified: Secondary | ICD-10-CM

## 2023-05-07 DIAGNOSIS — M5459 Other low back pain: Secondary | ICD-10-CM

## 2023-05-07 DIAGNOSIS — M6281 Muscle weakness (generalized): Secondary | ICD-10-CM

## 2023-05-07 NOTE — Therapy (Signed)
OUTPATIENT PHYSICAL THERAPY TREATMENT   Patient Name: Mary Russell MRN: [id number] DOB:May 13, 1976, 47 y.o., female Today's Date: 05/07/2023  END OF SESSION:  PT End of Session - 05/07/23 1301     Visit Number 18    Number of Visits 33    Date for PT Re-Evaluation 05/08/23    PT Start Time 1300    PT Stop Time 1325    PT Time Calculation (min) 25 min    Activity Tolerance Patient tolerated treatment well    Behavior During Therapy [facility] for tasks assessed/performed                   Past Medical History:  Diagnosis Date   Abdominal migraine    Anemia    Anxiety    Chronic migraine    Depression    Lumbar disc disease    Palpitations    Thyroid disease    Hoshimotos   Past Surgical History:  Procedure Laterality Date   CESAREAN SECTION  08/2004   06/2007   LUMBAR DISC SURGERY  03/2019   L5-S1   Patient Active Problem List   Diagnosis Date Noted   Thyroid disease    Palpitations    Abdominal migraine    Chronic migraine    Lumbar disc disease    Lumbar radiculopathy 04/30/2020     REFERRING PROVIDER:  Venita Lick, MD   REFERRING DIAG: M54.51 (ICD-10-CM) - Vertebrogenic low back pain   Rationale for Evaluation and Treatment: Rehabilitation  THERAPY DIAG:  Other low back pain  Difficulty in walking, not elsewhere classified  Muscle weakness (generalized)  ONSET DATE: approx 2020   SUBJECTIVE:                                                                                                                                                                                           SUBJECTIVE STATEMENT: I did great with the Yoga. I try to do less yard work when I do it. Working to stretch more. Still have good days and bad days. Went on a hike about 4 miles and my back handled it.     PERTINENT HISTORY:  Abdominal migraine, history of L5-S1 lumbar surgery, history of C-section  PAIN:  Are you having pain? yes: NPRS scale: 3/10 Pain  location: across low back (bilat) and Lt mid-thoracic Pain description: Aching gets worse throughout the day Aggravating factors: Increase in pain over time Relieving factors: Lay supine  PRECAUTIONS: None  WEIGHT BEARING RESTRICTIONS: No  FALLS:  Has patient fallen in last 6 months? 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    OBJECTIVE:   PATIENT SURVEYS:  FOTO 47  01/22/23: 53;  goal of 57  9/26: 63 COGNITIVE STATUS: Within functional limits for tasks assessed   RED FLAGS: None    SENSATION: WFL  POSTURE:  rounded shoulders, increased lumbar lordosis, and decreased thoracic kyphosis Lt PSIS elevation, in supine: Lt post innom rotation resulting in functional LLD (Rt longer)    Body Part #1 Lumbar   LE MMT LOWER EXTREMITY MMT: Not appropriate to test at evaluation due to notable pelvic rotation  MMT Right 5/20 Left 5/20 Rt/Lt 7/5 (lb) Rt/Lt 8/15 Rt/Lt 9/26  Hip flexion 5/5 4-/5 37.2/44.1    Hip extension- in qped knee ext 5/5 3/5 20.8/9.9 21.5/20.4   Hip abduction 4+/5 4-/5 33.0/28.8  38.6/41.2  Hip adduction       Hip internal rotation       Hip external rotation       Knee flexion   21.1/19    Knee extension   36.9/29.3 34.6/37.5    (Blank rows = not tested)    ROM: Patient demonstrates range of motion within functional limits but is very guarded in forward flexion utilizing hands as a brace on her legs 9/26- uses proper back hygiene to grab object from floor, one hand on knee   TREATMENT:                                                                                                                             Treatment                            9/26:  Crunches Eccentric roll back- suggested using TrX for  sit up support Discussion regarding progression of activity & pain awareness.    Treatment                            9/10:  Manual prone rib/thoracic mobs- cavitations noted with grade 4 mobilizations Prone swimmer Qped on elbows- hip ext with pelvic tilt; hip ext- tap edge of table & lift Sidelying hip burner series: hip abd, circles, arcs Downward  dog Yoga pose modifications   Treatment                            8/29:  Trigger Point Dry Needling, Manual Therapy Treatment:  Initial or subsequent education regarding Trigger Point Dry Needling: Subsequent Did patient give consent to treatment with Trigger Point Dry Needling: Yes TPDN with skilled palpation and monitoring followed by STM to the following muscles: L4-5 lumbar paraspinals bil, bilat glut med & min  Child pose 3 deep breaths  Hooklying post pelvic tilt Supine HS & ITB stretch with strap Qped: pelvic tilt, bird dog Primal push up   03/27/23 Pt seen for aquatic therapy today.  Treatment took place in water 3.5-4.75 ft in depth at the [facility] pool. Temp of water was 91.  Pt entered/exited the pool independently via stairs using step through pattern with bilat hand rail.   * walking backward/forward 1 lap in 4 ft *L stretch; L stretch with tail wagging *lumbopelvic stretching: posterior pelvic tilting  to neutral then hip hiking seated on solid noodle *Standing 3 way stretch using solid noodle x2 in each position R/L *Farmers carry using yellow HB. Cues for engaged core *cycling on noodle      PATIENT EDUCATION:  Education details: exercise form/rationale Person educated: Patient Education method: Explanation, Demonstration, Tactile cues, Verbal cues, Education comprehension: verbalized understanding, returned demonstration, verbal cues required, tactile cues required, and needs further education  HOME EXERCISE PROGRAM: Access Code: [email or web address] URL:  [email or web address]     ASSESSMENT:  CLINICAL IMPRESSION: Pt has met her goals at this time and d/c to independent gym program. Encouraged her to reach out to me with any further questions.     OBJECTIVE IMPAIRMENTS: decreased activity tolerance, difficulty walking, decreased strength, increased muscle spasms, improper body mechanics, postural dysfunction, and pain.   ACTIVITY LIMITATIONS: carrying, lifting, bending, sitting, standing, stairs, bed mobility, locomotion level, and caring for others  PARTICIPATION LIMITATIONS: meal prep, cleaning, laundry, driving, shopping, community activity, occupation, and yard work  PERSONAL FACTORS: Past/current experiences and Time since onset of injury/illness/exacerbation are also affecting patient's functional outcome.   REHAB POTENTIAL: Good  CLINICAL DECISION MAKING: Evolving/moderate complexity  EVALUATION COMPLEXITY: Moderate   GOALS: Goals reviewed with patient? Yes  SHORT TERM GOALS: Target date: 5/10  Compliance with home exercise program as it has been established in the short-term Baseline: Goal status: MET  2.  Independent and utilization of self-correction techniques for pelvic rotation correction Baseline:  Goal status:In progress - 02/19/23    LONG TERM GOALS: Target date: POC date  Will meet Foto goal Baseline:  Goal status:achieved  2.  Bilateral hip abduction strength within age-appropriate range Goal status: achieved  3.  Able to ride the bike without radicular symptoms Baseline:riding here and doing fine Goal status:achieved  4.  Able to utilize stretches, exercises, and self-care techniques to complete functional daily activities without limitation by pain Baseline:  Goal status: achieved    PLAN:  PT FREQUENCY: 1-2x/week  PT DURATION: other: Through POC date  PLANNED INTERVENTIONS: Therapeutic exercises, Therapeutic activity, Neuromuscular re-education, Balance training, Gait  training, Patient/Family education, Self Care, Joint mobilization, Stair training, Aquatic Therapy, Dry Needling, Electrical stimulation, Spinal mobilization, Cryotherapy, Moist heat, Taping, Ultrasound, Ionotophoresis 4mg /ml Dexamethasone, Manual therapy, and Re-evaluation.   Darrick Greenlaw C. Mehek Grega PT, DPT 05/07/23 1:29 PM   [facility] [address] [place], [place], [address] Phone: [phone]   Fax:  [phone]

## 2024-04-06 ENCOUNTER — Emergency Department (HOSPITAL_BASED_OUTPATIENT_CLINIC_OR_DEPARTMENT_OTHER)
Admission: EM | Admit: 2024-04-06 | Discharge: 2024-04-06 | Disposition: A | Discharge status: Home / Self Care | Attending: Emergency Medicine | Admitting: Emergency Medicine

## 2024-04-06 ENCOUNTER — Emergency Department (HOSPITAL_BASED_OUTPATIENT_CLINIC_OR_DEPARTMENT_OTHER)

## 2024-04-06 ENCOUNTER — Other Ambulatory Visit: Payer: Self-pay

## 2024-04-06 ENCOUNTER — Encounter (HOSPITAL_BASED_OUTPATIENT_CLINIC_OR_DEPARTMENT_OTHER): Payer: Self-pay | Admitting: Emergency Medicine

## 2024-04-06 DIAGNOSIS — G43809 Other migraine, not intractable, without status migrainosus: Secondary | ICD-10-CM | POA: Diagnosis not present

## 2024-04-06 DIAGNOSIS — G43909 Migraine, unspecified, not intractable, without status migrainosus: Secondary | ICD-10-CM | POA: Diagnosis present

## 2024-04-06 MED ORDER — PROCHLORPERAZINE EDISYLATE 10 MG/2ML IJ SOLN
10.0000 mg | Freq: Once | INTRAMUSCULAR | Status: AC
Start: 2024-04-06 — End: 2024-04-06
  Administered 2024-04-06: 10 mg via INTRAVENOUS
  Filled 2024-04-06: qty 2

## 2024-04-06 MED ORDER — DIPHENHYDRAMINE HCL 50 MG/ML IJ SOLN
12.5000 mg | Freq: Once | INTRAMUSCULAR | Status: AC
  Administered 2024-04-06: 12.5 mg via INTRAVENOUS
  Filled 2024-04-06: qty 1

## 2024-04-06 MED ORDER — DEXAMETHASONE SODIUM PHOSPHATE 10 MG/ML IJ SOLN
10.0000 mg | Freq: Once | INTRAMUSCULAR | Status: AC
  Administered 2024-04-06: 10 mg via INTRAVENOUS
  Filled 2024-04-06: qty 1

## 2024-04-06 MED ORDER — SODIUM CHLORIDE 0.9 % IV BOLUS
1000.0000 mL | Freq: Once | INTRAVENOUS | Status: AC
  Administered 2024-04-06: 1000 mL via INTRAVENOUS

## 2024-04-06 MED ORDER — MAGNESIUM SULFATE 2 GM/50ML IV SOLN
2.0000 g | Freq: Once | INTRAVENOUS | Status: AC
Start: 2024-04-06 — End: 2024-04-06
  Administered 2024-04-06: 2 g via INTRAVENOUS
  Filled 2024-04-06: qty 50

## 2024-04-06 NOTE — ED Notes (Signed)
 Reviewed AVS/discharge instruction with patient. Time allotted for and all questions answered. Patient is agreeable for d/c and escorted to ed exit by staff.

## 2024-04-06 NOTE — ED Notes (Signed)
 Patient transported to CT

## 2024-04-06 NOTE — ED Triage Notes (Signed)
 Migraine Started today 6 AM Not relieved with imitrex Nausea

## 2024-04-06 NOTE — ED Notes (Signed)
 Ambulatory to restroom

## 2024-04-06 NOTE — ED Provider Notes (Signed)
 [facility] EMERGENCY DEPARTMENT AT [facility] Provider Note   CSN: [id number] Arrival date & time: 04/06/24  1119     Patient presents with: Migraine   Mary Russell is a 48 y.o. female.   Patient here with migraine headache that started today gradually gotten worse.  Home migraine abortive medication not helping much.  Headaches gotten much more intense here over the last hour or 2.  Typically she responds well to her headache meds.  She did hit her head a couple days ago from a fall.  She denies any weakness numbness tingling.  No vision loss.  She does have sensitivity to light.  Denies any thunderclap headache.  Seems to be worse with movement at times.  Denies any fevers or chills.  The history is provided by the patient.       Prior to Admission medications   Medication Sig Start Date End Date Taking? Authorizing Provider  acyclovir  (ZOVIRAX ) 400 MG tablet TAKE 1 TABLET (400 MG TOTAL) BY MOUTH 3 (THREE) TIMES DAILY AS NEEDED. 06/11/20   Gladis Elsie BROCKS, PA-C  celecoxib  (CELEBREX ) 200 MG capsule Take 1 capsule (200 mg total) by mouth 2 (two) times daily. 07/03/21   Harris, Abigail, PA-C  Cholecalciferol (VITAMIN D3) 25 MCG (1000 UT) CAPS Take 1 capsule by mouth daily.    [provider]  [employer] PURELAX 17 GM/SCOOP powder Take by mouth as needed. 07/19/20   [provider]  EMGALITY 120 MG/ML SOAJ Inject 1 mL into the skin every 30 (thirty) days. 04/17/20   [provider]  gabapentin (NEURONTIN) 300 MG capsule Take 300 mg by mouth 3 (three) times daily. 08/02/20   [provider]  lidocaine (LIDODERM) 5 % 1 patch as needed. 08/02/20   [provider]  NP THYROID 90 MG tablet Take 90 mg by mouth every morning. 04/26/20   [provider]  ondansetron  (ZOFRAN ) 4 MG tablet Take 1 tablet (4 mg total) by mouth every 8 (eight) hours as needed for nausea or vomiting. 07/03/21   Harris, Abigail, PA-C  ondansetron  (ZOFRAN -ODT) 4  MG disintegrating tablet Take 4 mg by mouth every 8 (eight) hours as needed. 07/19/20   [provider]  predniSONE (DELTASONE) 10 MG tablet as needed.    [provider]  SUMAtriptan (IMITREX) 100 MG tablet Take 100 mg by mouth 2 (two) times daily as needed. 05/02/20   [provider]  traMADol  (ULTRAM ) 50 MG tablet Take 1 tablet (50 mg total) by mouth every 6 (six) hours as needed. 07/03/21   Harris, Abigail, PA-C  venlafaxine XR (EFFEXOR-XR) 37.5 MG 24 hr capsule Take 37.5 mg by mouth daily. 04/18/20   [provider]  vitamin B-12 (CYANOCOBALAMIN) 1000 MCG tablet Take 1,000 mcg by mouth daily.    [provider]    Allergies: Amoxicillin and Ambien [zolpidem]    Review of Systems  Updated Vital Signs BP 125/84   Pulse 100   Temp 98 F (36.7 C) (Oral)   Resp 20   SpO2 100%   Physical Exam Vitals and nursing note reviewed.  Constitutional:      General: She is not in acute distress.    Appearance: She is well-developed. She is not ill-appearing.  HENT:     Head: Normocephalic and atraumatic.     Nose: Nose normal.     Mouth/Throat:     Mouth: Mucous membranes are moist.  Eyes:     Extraocular Movements: Extraocular movements intact.  Conjunctiva/sclera: Conjunctivae normal.     Pupils: Pupils are equal, round, and reactive to light.  Cardiovascular:     Rate and Rhythm: Normal rate and regular rhythm.     Pulses: Normal pulses.     Heart sounds: Normal heart sounds. No murmur heard. Pulmonary:     Effort: Pulmonary effort is normal. No respiratory distress.     Breath sounds: Normal breath sounds.  Abdominal:     General: Abdomen is flat.     Palpations: Abdomen is soft.     Tenderness: There is no abdominal tenderness.  Musculoskeletal:        General: No swelling.     Cervical back: Normal range of motion and neck supple.  Skin:    General: Skin is warm and dry.     Capillary Refill: Capillary refill takes less than 2  seconds.  Neurological:     General: No focal deficit present.     Mental Status: She is alert and oriented to person, place, and time.     Cranial Nerves: No cranial nerve deficit.     Sensory: No sensory deficit.     Motor: No weakness.     Coordination: Coordination normal.     Comments: 5+ out of 5 strength throughout, normal sensation, no drift, normal finger-nose-finger, normal speech  Psychiatric:        Mood and Affect: Mood normal.     (all labs ordered are listed, but only abnormal results are displayed) Labs Reviewed - No data to display  EKG: None  Radiology: CT Head Wo Contrast Result Date: 04/06/2024 CLINICAL DATA:  Headache EXAM: CT HEAD WITHOUT CONTRAST TECHNIQUE: Contiguous axial images were obtained from the base of the skull through the vertex without intravenous contrast. RADIATION DOSE REDUCTION: This exam was performed according to the departmental dose-optimization program which includes automated exposure control, adjustment of the mA and/or kV according to patient size and/or use of iterative reconstruction technique. COMPARISON:  None Available. FINDINGS: Brain: No acute intracranial abnormality. Specifically, no hemorrhage, hydrocephalus, mass lesion, acute infarction, or significant intracranial injury. Vascular: No hyperdense vessel or unexpected calcification. Skull: No acute calvarial abnormality. Sinuses/Orbits: No acute findings Other: None IMPRESSION: No acute intracranial abnormality. Electronically Signed   By: Franky Crease M.D.   On: 04/06/2024 13:23     Procedures   Medications Ordered in the ED  prochlorperazine  (COMPAZINE ) injection 10 mg (10 mg Intravenous Given 04/06/24 1208)  diphenhydrAMINE  (BENADRYL ) injection 12.5 mg (12.5 mg Intravenous Given 04/06/24 1208)  magnesium  sulfate IVPB 2 g 50 mL (0 g Intravenous Stopped 04/06/24 1249)  sodium chloride  0.9 % bolus 1,000 mL (1,000 mLs Intravenous New Bag/Given 04/06/24 1203)  dexamethasone   (DECADRON ) injection 10 mg (10 mg Intravenous Given 04/06/24 1208)                                    Medical Decision Making Amount and/or Complexity of Data Reviewed Radiology: ordered.  Risk Prescription drug management.   Mary Russell is here with migraine headache.  Little bit worse than her normal migraines.  Unremarkable vitals.  Headache started gradually today got worse here over the last hour or 2 despite taking her headache medications at home.  She denies any weakness numbness tingling.  No thunderclap headache.  She is neurologically intact.  However being that this is worse than her normal headaches we will get a head CT to evaluate.  I have no concern for infectious process.  Will give her headache cocktail via IV and reevaluate.  Patient states aches greatly improved.  Head CT is unremarkable.  Overall suspect migraine headache today.  I do not have any concern for stroke or any other acute process.  Follow-up with primary care.  Discharge.  This chart was dictated using voice recognition software.  Despite best efforts to proofread,  errors can occur which can change the documentation meaning.      Final diagnoses:  Other migraine without status migrainosus, not intractable    ED Discharge Orders     None          Ruthe Cornet, DO 04/06/24 1336
